# Patient Record
Sex: Female | Born: 1945
Health system: Southern US, Community
[De-identification: ages and names within clinical notes are randomized; demographics above are authoritative.]

## PROBLEM LIST (undated history)

## (undated) DIAGNOSIS — K219 Gastro-esophageal reflux disease without esophagitis: Secondary | ICD-10-CM

## (undated) DIAGNOSIS — K449 Diaphragmatic hernia without obstruction or gangrene: Secondary | ICD-10-CM

## (undated) DIAGNOSIS — K222 Esophageal obstruction: Secondary | ICD-10-CM

## (undated) DIAGNOSIS — J45909 Unspecified asthma, uncomplicated: Secondary | ICD-10-CM

## (undated) DIAGNOSIS — T7840XA Allergy, unspecified, initial encounter: Secondary | ICD-10-CM

## (undated) DIAGNOSIS — E785 Hyperlipidemia, unspecified: Secondary | ICD-10-CM

## (undated) DIAGNOSIS — G8929 Other chronic pain: Secondary | ICD-10-CM

## (undated) DIAGNOSIS — R1011 Right upper quadrant pain: Secondary | ICD-10-CM

## (undated) DIAGNOSIS — H269 Unspecified cataract: Secondary | ICD-10-CM

## (undated) DIAGNOSIS — J309 Allergic rhinitis, unspecified: Secondary | ICD-10-CM

## (undated) DIAGNOSIS — I10 Essential (primary) hypertension: Secondary | ICD-10-CM

## (undated) HISTORY — DX: Other chronic pain: G89.29

## (undated) HISTORY — DX: Allergic rhinitis, unspecified: J30.9

## (undated) HISTORY — DX: Esophageal obstruction: K22.2

## (undated) HISTORY — DX: Essential (primary) hypertension: I10

## (undated) HISTORY — DX: Diaphragmatic hernia without obstruction or gangrene: K44.9

## (undated) HISTORY — DX: Allergy, unspecified, initial encounter: T78.40XA

## (undated) HISTORY — DX: Unspecified cataract: H26.9

## (undated) HISTORY — PX: COLONOSCOPY: SHX174

## (undated) HISTORY — DX: Gastro-esophageal reflux disease without esophagitis: K21.9

## (undated) HISTORY — DX: Hyperlipidemia, unspecified: E78.5

## (undated) HISTORY — DX: Right upper quadrant pain: R10.11

## (undated) HISTORY — DX: Unspecified asthma, uncomplicated: J45.909

---

## 2008-07-24 LAB — HM COLONOSCOPY: HM Colonoscopy: NORMAL

## 2009-03-24 ENCOUNTER — Encounter: Admission: RE | Admit: 2009-03-24 | Discharge: 2009-03-24 | Payer: Self-pay | Admitting: Family Medicine

## 2011-06-24 ENCOUNTER — Encounter: Payer: Self-pay | Admitting: Family Medicine

## 2011-06-27 ENCOUNTER — Ambulatory Visit (INDEPENDENT_AMBULATORY_CARE_PROVIDER_SITE_OTHER): Payer: MEDICARE | Admitting: Family Medicine

## 2011-06-27 ENCOUNTER — Encounter: Payer: Self-pay | Admitting: Family Medicine

## 2011-06-27 DIAGNOSIS — J683 Other acute and subacute respiratory conditions due to chemicals, gases, fumes and vapors: Secondary | ICD-10-CM

## 2011-06-27 DIAGNOSIS — I10 Essential (primary) hypertension: Secondary | ICD-10-CM | POA: Insufficient documentation

## 2011-06-27 DIAGNOSIS — Z0000 Encounter for general adult medical examination without abnormal findings: Secondary | ICD-10-CM

## 2011-06-27 DIAGNOSIS — H6120 Impacted cerumen, unspecified ear: Secondary | ICD-10-CM

## 2011-06-27 DIAGNOSIS — J309 Allergic rhinitis, unspecified: Secondary | ICD-10-CM

## 2011-06-27 DIAGNOSIS — K219 Gastro-esophageal reflux disease without esophagitis: Secondary | ICD-10-CM | POA: Insufficient documentation

## 2011-06-27 DIAGNOSIS — E785 Hyperlipidemia, unspecified: Secondary | ICD-10-CM

## 2011-06-27 DIAGNOSIS — M810 Age-related osteoporosis without current pathological fracture: Secondary | ICD-10-CM | POA: Insufficient documentation

## 2011-06-27 LAB — LIPID PANEL
Cholesterol: 191 mg/dL (ref 0–200)
HDL: 56 mg/dL
LDL Cholesterol: 98 mg/dL (ref 0–99)
Total CHOL/HDL Ratio: 3.4 ratio
Triglycerides: 186 mg/dL — ABNORMAL HIGH
VLDL: 37 mg/dL (ref 0–40)

## 2011-06-27 LAB — COMPREHENSIVE METABOLIC PANEL
Albumin: 4.3 g/dL (ref 3.5–5.2)
CO2: 24 mEq/L (ref 19–32)
Glucose, Bld: 88 mg/dL (ref 70–99)
Potassium: 4.2 mEq/L (ref 3.5–5.3)
Sodium: 141 mEq/L (ref 135–145)
Total Protein: 7.8 g/dL (ref 6.0–8.3)

## 2011-06-27 MED ORDER — PRAVASTATIN SODIUM 40 MG PO TABS: 40.0000 mg | ORAL_TABLET | Freq: Every day | ORAL | Status: DC

## 2011-06-27 MED ORDER — ALBUTEROL SULFATE HFA 108 (90 BASE) MCG/ACT IN AERS: 2.0000 | INHALATION_SPRAY | Freq: Four times a day (QID) | RESPIRATORY_TRACT | Status: DC | PRN

## 2011-06-27 MED ORDER — SUCRALFATE 1 G PO TABS: 1.0000 g | ORAL_TABLET | Freq: Two times a day (BID) | ORAL | Status: DC

## 2011-06-27 MED ORDER — FAMOTIDINE 20 MG PO TABS: 20.0000 mg | ORAL_TABLET | Freq: Two times a day (BID) | ORAL | Status: DC

## 2011-06-27 MED ORDER — LISINOPRIL 5 MG PO TABS: 5.0000 mg | ORAL_TABLET | Freq: Every day | ORAL | Status: DC

## 2011-06-27 NOTE — Progress Notes (Signed)
Subjective:    Patient ID: Rhonda Burns, female    DOB: [birth year], 66 y.o.   MRN: [id number]  HPI  Patient presents for welcome to medicare CPE.  1) Allergies- waking up with dry mouth  2) Ceruminosis- requests [place] to check ears and irrigate if necessary. Reduced hearing  3) Gerd- increase symptoms recently; now with nocturnal cough  4) History of osteoporosis- was on Fosamax in the past; due for repeat DEXA.  5) H. Zoster- 10/12  SH/ Spends several months at a time in [place], [place] caring for grandchildren  Review of Systems  See scanned paperwork    Objective:   Physical Exam  Constitutional: She appears well-developed and well-nourished.  HENT:  Head: Normocephalic and atraumatic.  Ears:  Nose: Mucosal edema and rhinorrhea present.  Mouth/Throat: Oropharynx is clear and moist.  Eyes: Conjunctivae and EOM are normal. Pupils are equal, round, and reactive to light.  Neck: Neck supple. No thyromegaly present.  Cardiovascular: Normal rate, regular rhythm, normal heart sounds and intact distal pulses.   Pulmonary/Chest: Effort normal and breath sounds normal.  Abdominal: Soft. Bowel sounds are normal. There is no tenderness.  Musculoskeletal: Normal range of motion.  Lymphadenopathy:    She has no cervical adenopathy.  Neurological: She is alert.  Skin: Skin is warm.  Psychiatric: She has a normal mood and affect.     Results for orders placed in visit on 06/27/11  COMPREHENSIVE METABOLIC PANEL      Component Value Range   Sodium 141  135 - 145 (mEq/L)   Potassium 4.2  3.5 - 5.3 (mEq/L)   Chloride 104  96 - 112 (mEq/L)   CO2 24  19 - 32 (mEq/L)   Glucose, Bld 88  70 - 99 (mg/dL)   BUN 13  6 - 23 (mg/dL)   Creat 4.09  8.11 - 9.14 (mg/dL)   Total Bilirubin 0.7  0.3 - 1.2 (mg/dL)   Alkaline Phosphatase 77  39 - 117 (U/L)   AST 29  0 - 37 (U/L)   ALT 26  0 - 35 (U/L)   Total Protein 7.8  6.0 - 8.3 (g/dL)   Albumin 4.3  3.5 - 5.2 (g/dL)   Calcium 78.2  8.4 -  10.5 (mg/dL)  LIPID PANEL      Component Value Range   Cholesterol 191  0 - 200 (mg/dL)   Triglycerides 956 (*) <150 (mg/dL)   HDL 56  >21 (mg/dL)   Total CHOL/HDL Ratio 3.4     VLDL 37  0 - 40 (mg/dL)   LDL Cholesterol 98  0 - 99 (mg/dL)  TSH      Component Value Range   TSH 1.845  0.350 - 4.500 (uIU/mL)  VITAMIN D 1,25 DIHYDROXY      Component Value Range   Vitamin D 1, 25 (OH) Total       Vitamin D3 1, 25 (OH)       Vitamin D2 1, 25 (OH)          Assessment & Plan:   1. Routine general medical examination at a health care facility    2. Hypertension  Comprehensive metabolic panel, Lipid panel, TSH, lisinopril (PRINIVIL,ZESTRIL) 5 MG tablet  3. Hyperlipidemia  pravastatin (PRAVACHOL) 40 MG tablet  4. Osteoporosis  Vitamin D 1,25 dihydroxy, DG Bone Density  5. Allergic rhinitis, cause unspecified    6. GERD (gastroesophageal reflux disease)  sucralfate (CARAFATE) 1 G tablet, famotidine (PEPCID) 20 MG tablet  7. Cerumen impaction  Ear wax removal,  8. Reactive airways dysfunction syndrome  albuterol (VENTOLIN HFA) 108 (90 BASE) MCG/ACT inhaler   Follow up is in 6 months; sooner if concerns arise.

## 2011-06-28 NOTE — Progress Notes (Signed)
Quick Note:  Please call patient with normal labs to date. Vitamin D still pending.Thanks! KR  ______

## 2011-06-29 LAB — VITAMIN D 1,25 DIHYDROXY
Vitamin D2 1, 25 (OH)2: <8 pg/mL
Vitamin D3 1, 25 (OH)2: 69 pg/mL

## 2011-07-06 ENCOUNTER — Ambulatory Visit
Admission: RE | Admit: 2011-07-06 | Discharge: 2011-07-06 | Disposition: A | Discharge status: Home / Self Care | Payer: Medicare Other | Source: Ambulatory Visit | Attending: Family Medicine | Admitting: Family Medicine

## 2011-07-13 ENCOUNTER — Encounter: Payer: Self-pay | Admitting: Radiology

## 2011-07-28 ENCOUNTER — Telehealth: Payer: Self-pay | Admitting: Family Medicine

## 2011-07-28 NOTE — Telephone Encounter (Signed)
Please contact patient and let her know her DEXA is normal. Follow up DEXA is in 5 years

## 2011-07-29 NOTE — Telephone Encounter (Signed)
Spoke with patient and told her dexa scan was normal and will do repeat in 5 years,

## 2012-03-29 ENCOUNTER — Encounter: Payer: Self-pay | Admitting: Family Medicine

## 2012-03-29 ENCOUNTER — Ambulatory Visit (INDEPENDENT_AMBULATORY_CARE_PROVIDER_SITE_OTHER): Payer: MEDICARE | Admitting: Family Medicine

## 2012-03-29 VITALS — BP 120/62 | HR 64 | Temp 99.2°F | Resp 16 | Ht 60.5 in | Wt 137.2 lb

## 2012-03-29 DIAGNOSIS — L723 Sebaceous cyst: Secondary | ICD-10-CM

## 2012-03-29 DIAGNOSIS — I10 Essential (primary) hypertension: Secondary | ICD-10-CM

## 2012-03-29 DIAGNOSIS — M79609 Pain in unspecified limb: Secondary | ICD-10-CM

## 2012-03-29 DIAGNOSIS — M79669 Pain in unspecified lower leg: Secondary | ICD-10-CM

## 2012-03-29 MED ORDER — SULFAMETHOXAZOLE-TMP DS 800-160 MG PO TABS: 1.0000 | ORAL_TABLET | Freq: Two times a day (BID) | ORAL | Status: DC

## 2012-03-29 NOTE — Progress Notes (Signed)
S:  Pt is here w/ "nagging pain" in right leg x 3 years. Onset 3 years.Has tried Aleve 1 tab hs some nights as needed -"when pain becomes unbearable". No history of trauma Smoker until 36 years ago. Pt denies coldness or numbness, no muscle cramps. Hx of Vit D def; takes Vit D3. Walks her dog 2 miles every day. Pt stays active every day , rain or shine.  Pt also has recurrent "boils" all over; today she has one in left armpit along with a "lump". It is not painful. The boil has had some d/c which smells. She has had this problem on and off for years. Surgery has been performed on armpit lesions. She has not been febrile, had chills, n/v, HA or rash.  ROS: As per HPI.  O:  Filed Vitals:   03/29/12 1130  BP: 120/62  Pulse: 64  Temp: 99.2 F (37.3 C)  Resp: 16   GEN: In NAD; WN/WD. HENT: [place]/AT; EOMI w/ clear conj and scl/ EACs/TMs normal. Oroph benign. NECK: Supple w/o LAN ot TMG. COR: RRR. Distal pulses normal (2+/=). LUNGS: Normal resp rate and effort. SKIN: Left axilla- scarring with 1 cm round minimally tender cystic lesion; no d/c. 2.5-3 cm soft mass lateral and superior to area of scarring.           Right axilla- similar appearance but absent acute lesion; soft tissue mass also present.           Back- cystic lesions w/ black plugs; nontender and no active d/c. MS: MAEs; no c/c/e. Right shin/calf area- no redness or swelling or tenderness.  NEURO: A&O x 3; CNs intact. Nonfocal. Gait is normal.   A/P: 1. Infected sebaceous cyst of skin   RX: SMZ-TMP D.S. 1 tab bid  #60 w/ 1 RF.  2. Lower leg pain   Conservative treatment recommended. If no improvement, RTC for xray. (?DEXA)  3. HTN, goal below 130/80   Stable; continue current medication.

## 2012-03-29 NOTE — Patient Instructions (Addendum)
Epidermal Cyst An epidermal cyst is usually a small, painless lump under the skin. Cysts often occur on the face, neck, stomach, chest, or genitals. The cyst may be filled with a bad smelling paste. Do not pop your cyst. Popping the cyst can cause pain and puffiness (swelling). HOME CARE   Only take medicines as told by your doctor.  Take your medicine (antibiotics) as told. Finish it even if you start to feel better. GET HELP RIGHT AWAY IF:  Your cyst is tender, red, or puffy.  You are not getting better, or you are getting worse.  You have any questions or concerns. MAKE SURE YOU:  Understand these instructions.  Will watch your condition.  Will get help right away if you are not doing well or get worse. Document Released: 05/19/2004 Document Revised: 10/11/2011 Document Reviewed: 10/18/2010 [facility] Patient Information 2013 [place], [place].   You can use Arnica Gel topically on painful areas of the body; rub in  Small amount 2-3 times a day. Also, you can take Tumeric 1/4-1/2 teaspoon mixed with foods or take 1/2 teaspoon of mustard daily for joint pains.

## 2012-05-31 ENCOUNTER — Other Ambulatory Visit: Payer: Self-pay | Admitting: *Deleted

## 2012-05-31 DIAGNOSIS — I10 Essential (primary) hypertension: Secondary | ICD-10-CM

## 2012-05-31 MED ORDER — LISINOPRIL 5 MG PO TABS: 5.0000 mg | ORAL_TABLET | Freq: Every day | ORAL | Status: DC

## 2012-06-14 ENCOUNTER — Telehealth: Payer: Self-pay

## 2012-06-14 DIAGNOSIS — I10 Essential (primary) hypertension: Secondary | ICD-10-CM

## 2012-06-14 MED ORDER — LISINOPRIL 5 MG PO TABS: 5.0000 mg | ORAL_TABLET | Freq: Every day | ORAL | Status: DC

## 2012-06-14 NOTE — Telephone Encounter (Signed)
Patient would like [place] to call her regarding her prescriptions please call her at [phone]

## 2012-06-14 NOTE — Telephone Encounter (Signed)
Spoke to patient she is in [place] until March 8th., she is running low on her Lisinopril and needed 30 day Rx sent in to pharmacy in [place], this is done for her. Amy

## 2012-06-14 NOTE — Telephone Encounter (Signed)
Called pt left message for her to call me back

## 2012-07-05 ENCOUNTER — Ambulatory Visit: Payer: MEDICARE

## 2012-07-05 ENCOUNTER — Ambulatory Visit (INDEPENDENT_AMBULATORY_CARE_PROVIDER_SITE_OTHER): Payer: MEDICARE | Admitting: Family Medicine

## 2012-07-05 ENCOUNTER — Encounter: Payer: Self-pay | Admitting: Family Medicine

## 2012-07-05 VITALS — BP 134/73 | HR 78 | Temp 97.3°F | Resp 16 | Ht 61.0 in | Wt 133.0 lb

## 2012-07-05 DIAGNOSIS — I10 Essential (primary) hypertension: Secondary | ICD-10-CM

## 2012-07-05 DIAGNOSIS — M25569 Pain in unspecified knee: Secondary | ICD-10-CM

## 2012-07-05 DIAGNOSIS — M25561 Pain in right knee: Secondary | ICD-10-CM

## 2012-07-05 DIAGNOSIS — E785 Hyperlipidemia, unspecified: Secondary | ICD-10-CM

## 2012-07-05 LAB — COMPREHENSIVE METABOLIC PANEL
ALT: 17 U/L (ref 0–35)
AST: 22 U/L (ref 0–37)
Albumin: 4.2 g/dL (ref 3.5–5.2)
Calcium: 9.4 mg/dL (ref 8.4–10.5)
Chloride: 109 mEq/L (ref 96–112)
Creat: 0.77 mg/dL (ref 0.50–1.10)
Potassium: 3.6 mEq/L (ref 3.5–5.3)

## 2012-07-05 LAB — CBC WITH DIFFERENTIAL/PLATELET
Basophils Relative: 0 % (ref 0–1)
Eosinophils Absolute: 0.2 10*3/uL (ref 0.0–0.7)
Hemoglobin: 12 g/dL (ref 12.0–15.0)
MCH: 29.1 pg (ref 26.0–34.0)
MCHC: 33.9 g/dL (ref 30.0–36.0)
Monocytes Relative: 8 % (ref 3–12)
Neutrophils Relative %: 52 % (ref 43–77)

## 2012-07-05 MED ORDER — PRAVASTATIN SODIUM 40 MG PO TABS: 40.0000 mg | ORAL_TABLET | Freq: Every day | ORAL | Status: DC

## 2012-07-05 MED ORDER — LISINOPRIL 5 MG PO TABS: 5.0000 mg | ORAL_TABLET | Freq: Every day | ORAL | Status: DC

## 2012-07-05 NOTE — Progress Notes (Signed)
S:  This 67 y.o. AA female is here for HTN follow-up; BP is controlled w/o adverse medication effects. She denies fatigue, abnormal weight change, CP or tightness, palpitations, edema, SOB or cough, HA, numbness, weakness or syncope.   Pt continues to have lateral right lower leg pain, responsive to Aleve and other nonpharmacologic measures. She reports that she used to run daily > 15 years ago; she wore good running shoes and denies any hx of injury while running.  Lower leg pain is associated w/ knee pain w/o redness, swelling, give-way or significant weakness. She is concerned about bone cancer because her 25 y.o. sister just died secondary to breast cancer that metastasized to bone.  ROS: As per HPI; negative for fever/ chills, diaphoresis, fatigue, abnormal weight loss, long bone ache or pain, rashes or abnormal bruising or enlarged lymph nodes.   O:  Filed Vitals:   07/05/12 1351  BP: 134/73  Pulse: 78  Temp: 97.3 F (36.3 C)  Resp: 16   GEN: In nAD; WN,WD. HENT: [place]/AT; EOMI w/ clear conj/ sclerae. Oroph clear and moist. COR: RRR. No edema. LUNGS: Normal resp rate and effort. MS: R lower leg- (lateral aspect anterior to calf muscle) nontender firm immobile 3-4 cm nodule. Knee joint is normal w/o effusion, warmth or redness.        All other major joints and muscle groups are normal. SKIN: W&D; no rashes or erythema. NEURO: A&O x 3; CNs intact. Motor/ sensory function is normal. Gait is normal.   [facility] reading (PRIMARY) by  Dr. Audria Nine: R Lower leg (L lower done for comparison)-  Tibia and fibula appear normal; R lateral aspect of calf shows muscle scar or soft-tissue swelling.   A/P:  Pain in joint, lower leg, right - Plan: DG Tibia/Fibula Right, Sedimentation Rate, CBC with Differential Get OTC topical analgesic and use as directed.  Hypertension - controlled.   Plan: lisinopril (PRINIVIL,ZESTRIL) 5 MG tablet, Comprehensive metabolic panel, CBC with  Differential  Hyperlipidemia - Plan: pravastatin (PRAVACHOL) 40 MG tablet

## 2012-07-05 NOTE — Patient Instructions (Signed)
Muscle Tear A muscle tear is usually caused by over-exertion or stretching. The muscle often takes a while to heal. Muscle tears require 3 to 4 weeks to heal completely. A history of the injury and a physical exam may be performed. Sometimes, the injury is identified with radiographs and an MRI. Treatment for muscle injuries includes:  Resting and protecting the affected area until pain with motion is gone.  Putting ice on the injured area.  Put ice in a bag.  Place a towel between your skin and the bag.  Leave the ice on for 15 to 20 minutes, 3 to 4 times a day.  After two days, you can use heat to relieve spasms.  Using compression wraps to help control swelling and limit movement.  Raising (elevate) the injured area above the level of the heart (if possible) for the first 1 to 2 days after the injury.  Medicine may be prescribed to reduce pain and inflammation. Avoid strenuous activities that bring on muscle pain. Exercises to strengthen and stretch the injured muscle can help heal the muscle and prevent repeated injury. After the pain and swelling are gone, you may begin gradual strengthening exercises. Begin range-of-motion exercises and gentle stretching after 3 to 4 days of rest.  SEEK MEDICAL CARE IF:  Your injured muscle is not improving after 1 week of treatment. Document Released: 05/19/2004 Document Revised: 07/04/2011 Document Reviewed: 10/24/2008 [facility] Patient Information 2013 [place], [place].    You can continue to take Aleve 1 tablet daily with food for pain relief; the "horse linament" that you are going to try is a good idea. Continue to wear good footwear to help reduce strain on your lower legs.

## 2012-07-06 NOTE — Progress Notes (Signed)
Quick Note:  Please notify pt that results are normal.   Provide pt with copy of labs. ______ 

## 2012-07-07 ENCOUNTER — Encounter: Payer: Self-pay | Admitting: *Deleted

## 2012-07-19 ENCOUNTER — Encounter: Payer: Self-pay | Admitting: Family Medicine

## 2012-08-23 ENCOUNTER — Encounter: Payer: MEDICARE | Admitting: Family Medicine

## 2012-09-27 ENCOUNTER — Ambulatory Visit (INDEPENDENT_AMBULATORY_CARE_PROVIDER_SITE_OTHER): Payer: MEDICARE | Admitting: Family Medicine

## 2012-09-27 ENCOUNTER — Encounter: Payer: Self-pay | Admitting: Family Medicine

## 2012-09-27 VITALS — BP 130/66 | HR 73 | Temp 98.7°F | Resp 16 | Ht 60.5 in | Wt 136.6 lb

## 2012-09-27 DIAGNOSIS — Z139 Encounter for screening, unspecified: Secondary | ICD-10-CM

## 2012-09-27 DIAGNOSIS — Z1159 Encounter for screening for other viral diseases: Secondary | ICD-10-CM

## 2012-09-27 DIAGNOSIS — E785 Hyperlipidemia, unspecified: Secondary | ICD-10-CM

## 2012-09-27 DIAGNOSIS — Z0000 Encounter for general adult medical examination without abnormal findings: Secondary | ICD-10-CM

## 2012-09-27 DIAGNOSIS — J45909 Unspecified asthma, uncomplicated: Secondary | ICD-10-CM

## 2012-09-27 DIAGNOSIS — J683 Other acute and subacute respiratory conditions due to chemicals, gases, fumes and vapors: Secondary | ICD-10-CM | POA: Insufficient documentation

## 2012-09-27 LAB — POCT URINALYSIS DIPSTICK
Blood, UA: NEGATIVE
Ketones, UA: NEGATIVE
Leukocytes, UA: NEGATIVE
Protein, UA: NEGATIVE
Spec Grav, UA: >=1.03
pH, UA: 5.5

## 2012-09-27 LAB — ALT: ALT: 20 U/L (ref 0–35)

## 2012-09-27 LAB — HEPATITIS C ANTIBODY: HCV Ab: NEGATIVE

## 2012-09-27 LAB — LIPID PANEL
LDL Cholesterol: 34 mg/dL (ref 0–99)
Total CHOL/HDL Ratio: 2.2 Ratio
VLDL: 24 mg/dL (ref 0–40)

## 2012-09-27 LAB — IFOBT (OCCULT BLOOD): IFOBT: NEGATIVE

## 2012-09-27 MED ORDER — SUCRALFATE 1 G PO TABS: 1.0000 g | ORAL_TABLET | Freq: Two times a day (BID) | ORAL | Status: DC

## 2012-09-27 MED ORDER — ALBUTEROL SULFATE HFA 108 (90 BASE) MCG/ACT IN AERS: 2.0000 | INHALATION_SPRAY | Freq: Four times a day (QID) | RESPIRATORY_TRACT | Status: DC | PRN

## 2012-09-27 MED ORDER — PRAVASTATIN SODIUM 40 MG PO TABS: 40.0000 mg | ORAL_TABLET | Freq: Every day | ORAL | Status: DC

## 2012-09-27 MED ORDER — FLUTICASONE PROPIONATE 50 MCG/ACT NA SUSP: 2.0000 | Freq: Every day | NASAL | Status: DC

## 2012-09-27 MED ORDER — LISINOPRIL 5 MG PO TABS: 5.0000 mg | ORAL_TABLET | Freq: Every day | ORAL | Status: DC

## 2012-09-27 NOTE — Patient Instructions (Signed)

## 2012-09-27 NOTE — Progress Notes (Signed)
Subjective:    Patient ID: Rhonda Burns, female    DOB: [birth year], 67 y.o.   MRN: [id number]  HPI  This 67 y.o. AA female is here for [facility] annual wellness exam. She has HTN and lipid disorder  treated with chronic medications w/o adverse effects.    HCM: PAP- March 2012 (normal)            MMG- pt has appt. 10/04/12.            ECG- 06/12/2012 (normal).            DEXA- 07/05/2012 (normal).            CRS- 07/31/2008 (normal).            IMM- current; needs Zostavax.  Patient Active Problem List   Diagnosis Date Noted  . Hypertension   . Hyperlipidemia   . Osteoporosis   . Allergic rhinitis, cause unspecified   . GERD (gastroesophageal reflux disease)     PMHx, Soc Hx and Fam Hx reviewed.    Review of Systems  Constitutional: Negative.   HENT: Positive for sneezing.   Eyes: Positive for itching.       Associated w/ A.R.  Respiratory: Positive for cough.        Associated w/ A.R.  Cardiovascular: Negative.   Gastrointestinal: Positive for abdominal pain and blood in stool.       Pt has hemorrhoids w/ intermittent abd pain; reports a lot of gas, sometimes associated w/ certain foods.  Endocrine:       Change in hair  Genitourinary: Positive for urgency and flank pain.       This is chronic and evaluated w/ imaging but no clear etiology found; pt has problems w/ constipation.  Musculoskeletal: Positive for arthralgias.  Skin: Negative.        Chronic recurrent cystic lesions; removed in past but always recur.  Allergic/Immunologic: Negative.   Neurological: Positive for light-headedness.       Sometimes postural.  Hematological: Negative.   Psychiatric/Behavioral: Positive for decreased concentration. The patient is nervous/anxious.        This is chronic and pt not interested in medication.       Objective:   Physical Exam  Nursing note and vitals reviewed. Constitutional: She is oriented to person, place, and time. Vital signs are normal. She appears well-developed and  well-nourished. No distress.  HENT:  Head: Normocephalic and atraumatic.  Right Ear: Hearing, external ear and ear canal normal. Tympanic membrane is scarred. Tympanic membrane is not injected and not retracted. No middle ear effusion. No decreased hearing is noted.  Left Ear: Hearing, tympanic membrane and external ear normal. Tympanic membrane is not injected and not retracted.  No middle ear effusion. No decreased hearing is noted.  Nose: Nose normal. No mucosal edema, rhinorrhea, nasal deformity or septal deviation.  Mouth/Throat: Uvula is midline, oropharynx is clear and moist and mucous membranes are normal. She has dentures. No oral lesions.  Dentures- upper plate only.  Eyes: Conjunctivae, EOM and lids are normal. Pupils are equal, round, and reactive to light. No scleral icterus.  Fundoscopic exam:      The right eye shows no arteriolar narrowing, no AV nicking and no papilledema. The right eye shows red reflex.       The left eye shows no arteriolar narrowing, no AV nicking and no papilledema. The left eye shows red reflex.  Neck: Normal range of motion. Neck supple. No JVD present. No thyromegaly present.  Cardiovascular: Normal rate, regular rhythm, normal heart sounds and intact distal pulses.  Exam reveals no gallop and no friction rub.   No murmur heard. Pulmonary/Chest: Effort normal and breath sounds normal. No respiratory distress. She has no wheezes. She has no rales. Right breast exhibits no inverted nipple, no mass, no nipple discharge, no skin change and no tenderness. Left breast exhibits no inverted nipple, no mass, no nipple discharge, no skin change and no tenderness. Breasts are symmetrical.  Abdominal: Soft. Normal appearance, normal aorta and bowel sounds are normal. She exhibits no distension, no pulsatile midline mass and no mass. There is no hepatosplenomegaly. There is no tenderness. There is no guarding and no CVA tenderness. No hernia.  Genitourinary: Rectal exam  shows external hemorrhoid. Rectal exam shows no fissure, no mass, no tenderness and anal tone normal. Guaiac negative stool.  NEFG; no PAP or pelvic exam performed.  Musculoskeletal: Normal range of motion. She exhibits no edema and no tenderness.  Lymphadenopathy:       Head (right side): No submental, no submandibular, no tonsillar, no posterior auricular and no occipital adenopathy present.       Head (left side): No submental, no submandibular, no tonsillar, no posterior auricular and no occipital adenopathy present.    She has no cervical adenopathy.       Right cervical: No superficial cervical and no posterior cervical adenopathy present.      Left cervical: No superficial cervical and no posterior cervical adenopathy present.    She has no axillary adenopathy.       Right: No inguinal and no supraclavicular adenopathy present.       Left: No inguinal and no supraclavicular adenopathy present.  Neurological: She is alert and oriented to person, place, and time. She has normal reflexes. No cranial nerve deficit. She exhibits normal muscle tone. Coordination normal.  Skin: Skin is warm and dry.  Back (mid upper)- 1.5 cm firm nodule with blackhead; nontender and no active drainage.   Psychiatric: She has a normal mood and affect. Her behavior is normal. Judgment and thought content normal.     Results for orders placed in visit on 09/27/12  POCT URINALYSIS DIPSTICK      Result Value Range   Color, UA yellow     Clarity, UA clear     Glucose, UA neg     Bilirubin, UA neg     Ketones, UA neg     Spec Grav, UA >=1.030     Blood, UA neg     pH, UA 5.5     Protein, UA neg     Urobilinogen, UA 0.2     Nitrite, UA neg     Leukocytes, UA Negative    IFOBT (OCCULT BLOOD)      Result Value Range   IFOBT Negative          Assessment & Plan:  Routine general medical examination at a health care facility - Plan: POCT urinalysis dipstick, IFOBT POC (occult bld, rslt in  office)  Other and unspecified hyperlipidemia - encouraged healthy nutrition (Mediterranean Diet) Plan: Lipid panel, ALT  Reactive airways dysfunction syndrome, unspecified asthma severity, uncomplicated - Plan: albuterol (VENTOLIN HFA) 108 (90 BASE) MCG/ACT inhaler  Need for hepatitis C screening test - Plan: Hepatitis C antibody  Continue current medications: Lisinopril 5 mg 1 tab daily, Pravastatin 40 mg 1 tab at bedtime, Sucralfate 1 tab bid prn.

## 2012-09-28 ENCOUNTER — Telehealth: Payer: Self-pay | Admitting: Family Medicine

## 2012-09-28 NOTE — Progress Notes (Signed)
Quick Note:  Please notify pt that results are normal.   Provide pt with copy of labs. ______ 

## 2012-09-28 NOTE — Telephone Encounter (Signed)
[facility] Carafate, albuterol, flonase, lisinopril, pravastatin.

## 2012-09-29 ENCOUNTER — Encounter: Payer: Self-pay | Admitting: *Deleted

## 2012-10-18 ENCOUNTER — Encounter: Payer: Self-pay | Admitting: Family Medicine

## 2012-11-28 ENCOUNTER — Other Ambulatory Visit: Payer: Self-pay

## 2012-11-29 ENCOUNTER — Ambulatory Visit: Payer: MEDICARE | Admitting: Family Medicine

## 2013-02-28 ENCOUNTER — Other Ambulatory Visit: Payer: Self-pay

## 2013-03-19 ENCOUNTER — Encounter (HOSPITAL_COMMUNITY): Payer: Self-pay | Admitting: Emergency Medicine

## 2013-03-19 ENCOUNTER — Emergency Department (HOSPITAL_COMMUNITY)
Admission: EM | Admit: 2013-03-19 | Discharge: 2013-03-20 | Disposition: A | Discharge status: Home / Self Care | Payer: Medicare Other | Attending: Emergency Medicine | Admitting: Emergency Medicine

## 2013-03-19 DIAGNOSIS — Z8739 Personal history of other diseases of the musculoskeletal system and connective tissue: Secondary | ICD-10-CM | POA: Insufficient documentation

## 2013-03-19 DIAGNOSIS — T7840XA Allergy, unspecified, initial encounter: Secondary | ICD-10-CM

## 2013-03-19 DIAGNOSIS — L272 Dermatitis due to ingested food: Secondary | ICD-10-CM | POA: Insufficient documentation

## 2013-03-19 DIAGNOSIS — Z87891 Personal history of nicotine dependence: Secondary | ICD-10-CM | POA: Insufficient documentation

## 2013-03-19 DIAGNOSIS — I10 Essential (primary) hypertension: Secondary | ICD-10-CM | POA: Insufficient documentation

## 2013-03-19 DIAGNOSIS — J45909 Unspecified asthma, uncomplicated: Secondary | ICD-10-CM | POA: Insufficient documentation

## 2013-03-19 DIAGNOSIS — IMO0002 Reserved for concepts with insufficient information to code with codable children: Secondary | ICD-10-CM | POA: Insufficient documentation

## 2013-03-19 DIAGNOSIS — Z7982 Long term (current) use of aspirin: Secondary | ICD-10-CM | POA: Insufficient documentation

## 2013-03-19 DIAGNOSIS — E785 Hyperlipidemia, unspecified: Secondary | ICD-10-CM | POA: Insufficient documentation

## 2013-03-19 DIAGNOSIS — K219 Gastro-esophageal reflux disease without esophagitis: Secondary | ICD-10-CM | POA: Insufficient documentation

## 2013-03-19 DIAGNOSIS — Z79899 Other long term (current) drug therapy: Secondary | ICD-10-CM | POA: Insufficient documentation

## 2013-03-19 MED ORDER — FAMOTIDINE 20 MG PO TABS
20.0000 mg | ORAL_TABLET | Freq: Once | ORAL | Status: AC
  Administered 2013-03-19: 20 mg via ORAL
  Filled 2013-03-19: qty 1

## 2013-03-19 MED ORDER — PREDNISONE 20 MG PO TABS
60.0000 mg | ORAL_TABLET | Freq: Once | ORAL | Status: AC
  Administered 2013-03-19: 60 mg via ORAL
  Filled 2013-03-19: qty 3

## 2013-03-19 NOTE — ED Notes (Signed)
Pt states she was eating a piece of [place] chocolate cake and she believes that she had a reaction to the pecans in the cake. Pt states her eyes got swollen and she felt like she had a rash on her face. Pt has no visible hives or redness to face. Pt is not in respiratory distress. Pt took 2 benadryl prior to arrival ago.

## 2013-03-19 NOTE — ED Provider Notes (Signed)
CSN: [id number]     Arrival date & time 03/19/13  2227 History   First MD Initiated Contact with Patient 03/19/13 2343     Chief Complaint  Patient presents with  . Allergic Reaction   (Consider location/radiation/quality/duration/timing/severity/associated sxs/prior Treatment) Patient is a 67 y.o. female presenting with allergic reaction. The history is provided by the patient.  Allergic Reaction Presenting symptoms: swelling   Presenting symptoms: no difficulty swallowing   Severity:  Moderate Prior allergic episodes:  No prior episodes Context: food   Context comment:  Immediately after german chocolate cake Relieved by:  Antihistamines Worsened by:  Nothing tried Ineffective treatments:  None tried Symptoms resolved by evaluation  Past Medical History  Diagnosis Date  . Hypertension   . Hyperlipidemia   . Osteoporosis   . Allergy   . Osteoporosis   . Allergic rhinitis, cause unspecified   . GERD (gastroesophageal reflux disease)   . Asthma    Past Surgical History  Procedure Laterality Date  . Cesarean section     Family History  Problem Relation Age of Onset  . Alzheimer's disease Mother   . Asthma Sister    History  Substance Use Topics  . Smoking status: Former Games developer  . Smokeless tobacco: Not on file  . Alcohol Use: No   OB History   Grav Para Term Preterm Abortions TAB SAB Ect Mult Living                 Review of Systems  HENT: Positive for facial swelling. Negative for drooling and trouble swallowing.   All other systems reviewed and are negative.    Allergies  Review of patient's allergies indicates no known allergies.  Home Medications   Current Outpatient Rx  Name  Route  Sig  Dispense  Refill  . EXPIRED: albuterol (PROVENTIL HFA;VENTOLIN HFA) 108 (90 BASE) MCG/ACT inhaler   Inhalation   Inhale 2 puffs into the lungs every 6 (six) hours as needed for wheezing. NEED REFILLS         . albuterol (VENTOLIN HFA) 108 (90 BASE) MCG/ACT  inhaler   Inhalation   Inhale 2 puffs into the lungs every 6 (six) hours as needed for wheezing.   1 Inhaler   3   . aspirin 325 MG tablet   Oral   Take 325 mg by mouth daily.         . calcium citrate-vitamin D (CITRACAL+D) 315-200 MG-UNIT per tablet   Oral   Take 1 tablet by mouth 2 (two) times daily.         . calcium gluconate 500 MG tablet   Oral   Take 500 mg by mouth daily.         Marland Kitchen EXPIRED: famotidine (PEPCID) 20 MG tablet   Oral   Take 20 mg by mouth 2 (two) times daily. TAKE AS NEEDED         . fish oil-omega-3 fatty acids 1000 MG capsule   Oral   Take 2 g by mouth daily.         . fluticasone (FLONASE) 50 MCG/ACT nasal spray   Nasal   Place 2 sprays into the nose daily.   16 g   6   . lisinopril (PRINIVIL,ZESTRIL) 5 MG tablet   Oral   Take 1 tablet (5 mg total) by mouth daily.   90 tablet   3   . pravastatin (PRAVACHOL) 40 MG tablet   Oral   Take 1 tablet (40 mg  total) by mouth daily.   90 tablet   3   . sucralfate (CARAFATE) 1 G tablet   Oral   Take 1 tablet (1 g total) by mouth 2 (two) times daily.   180 tablet   1    BP 150/65  Pulse 121  Temp(Src) 97.7 F (36.5 C) (Oral)  Resp 18  Wt 140 lb 11.2 oz (63.821 kg)  SpO2 93% Physical Exam  Constitutional: She is oriented to person, place, and time. She appears well-developed and well-nourished. No distress.  HENT:  Head: Normocephalic and atraumatic.  Mouth/Throat: Oropharynx is clear and moist.  No swelling of the lips tongue or uvula  Eyes: Conjunctivae are normal. Pupils are equal, round, and reactive to light.  Neck: Normal range of motion. Neck supple.  Cardiovascular: Normal rate, regular rhythm and intact distal pulses.   Pulmonary/Chest: Effort normal and breath sounds normal. No stridor. No respiratory distress. She has no wheezes.  Abdominal: Soft. Bowel sounds are normal. There is no tenderness. There is no rebound and no guarding.  Musculoskeletal: Normal range of  motion.  Neurological: She is alert and oriented to person, place, and time.  Skin: Skin is warm and dry. No rash noted.  Psychiatric: She has a normal mood and affect.    ED Course  Procedures (including critical care time) Labs Review Labs Reviewed - No data to display Imaging Review No results found.  EKG Interpretation   None       MDM  No diagnosis found. Feeling well no more german chocolate cake or any of its components.  No nuts of any kind.  Inform your physician will dc with epi pen and steroids.  Patient takes pepcid.  Take benadryl every six hours    Mili Piltz K Tashara Suder-Rasch, MD 03/20/13 [phone]

## 2013-03-19 NOTE — ED Notes (Signed)
Pt. Reports facial swelling after eating nuts around 2130 with slight difficulty breathing. Pt. Took two benadryl and reports breathing and swelling are better. Airway intact, no difficulty breathing or SOB. Pt. Resting in bed comfortably.

## 2013-03-20 ENCOUNTER — Encounter (HOSPITAL_COMMUNITY): Payer: Self-pay | Admitting: Emergency Medicine

## 2013-03-20 ENCOUNTER — Encounter: Payer: Self-pay | Admitting: Family Medicine

## 2013-03-20 ENCOUNTER — Ambulatory Visit (INDEPENDENT_AMBULATORY_CARE_PROVIDER_SITE_OTHER): Payer: MEDICARE | Admitting: Family Medicine

## 2013-03-20 VITALS — BP 158/80 | HR 85 | Temp 98.4°F | Resp 16 | Ht 59.5 in | Wt 147.0 lb

## 2013-03-20 DIAGNOSIS — T7840XD Allergy, unspecified, subsequent encounter: Secondary | ICD-10-CM

## 2013-03-20 DIAGNOSIS — I10 Essential (primary) hypertension: Secondary | ICD-10-CM

## 2013-03-20 DIAGNOSIS — Z5189 Encounter for other specified aftercare: Secondary | ICD-10-CM

## 2013-03-20 MED ORDER — EPINEPHRINE 0.3 MG/0.3ML IJ SOAJ: 0.3000 mg | Freq: Once | INTRAMUSCULAR | Status: DC

## 2013-03-20 MED ORDER — SULFAMETHOXAZOLE-TRIMETHOPRIM 800-160 MG PO TABS: 1.0000 | ORAL_TABLET | Freq: Two times a day (BID) | ORAL | Status: DC

## 2013-03-20 MED ORDER — PREDNISONE 20 MG PO TABS: ORAL_TABLET | ORAL | Status: DC

## 2013-03-20 NOTE — Progress Notes (Signed)
S:  This 67 y.o. AA female is here for HTN follow-up. She is compliant w/ medications w/o adverse effects. Unfortunately, she had to seek care in [facility] ED last evening for allergic reaction to food item; pt ingested quite a bit of chocolate yesterday. No previous sensitivity or allergy known. Facial swelling occurred after eating a slice of [place] Choc cake; pt took Benadryl and was transported to ED by family members. She was treated and released w/ RXs for oral steroids and Epi-Pen. She is feeling better today but still has some swelling about the eyes and states she does not "feel quite right" in her head.  Patient Active Problem List   Diagnosis Date Noted  . Reactive airways dysfunction syndrome 09/27/2012  . Hypertension   . Hyperlipidemia   . Osteoporosis   . Allergic rhinitis, cause unspecified   . GERD (gastroesophageal reflux disease)    PMHx, Soc and Fam Hx reviewed.  Medications reconciled.  ROS: AS per HPI.  O: Filed Vitals:   03/20/13 1036  BP: 158/80  Pulse: 85  Temp: 98.4 F (36.9 C)  Resp: 16   GEN: In NAD; WN,WD. HENT: [place]/AT; EOMI w/ periorbital edema (mild). Otherwise unremarkable. COR: RRR. LUNGS: Normal resp rate and effort. SKIN: W&D; intact w/o erythema or rashes. NEURO: A&O x 3; CNs intact. Nonfocal.  A/P: Hypertension- Stable; no medication changes at this time.  Allergic reaction, subsequent encounter- Food avoidance as advised; take oral steroid as directed. Get Epi-Pen to have on hand if needed. Allergy referral if recurrence; otherwise, pt prefers to do watchful waiting.

## 2013-03-29 ENCOUNTER — Ambulatory Visit: Payer: MEDICARE | Admitting: Family Medicine

## 2013-07-05 ENCOUNTER — Encounter: Payer: Self-pay | Admitting: Family Medicine

## 2013-07-05 ENCOUNTER — Ambulatory Visit: Payer: Medicare Other

## 2013-07-05 ENCOUNTER — Ambulatory Visit (INDEPENDENT_AMBULATORY_CARE_PROVIDER_SITE_OTHER): Payer: MEDICARE | Admitting: Family Medicine

## 2013-07-05 VITALS — BP 131/88 | HR 79 | Temp 98.6°F | Resp 16 | Ht 60.5 in | Wt 137.0 lb

## 2013-07-05 DIAGNOSIS — J683 Other acute and subacute respiratory conditions due to chemicals, gases, fumes and vapors: Secondary | ICD-10-CM

## 2013-07-05 DIAGNOSIS — I499 Cardiac arrhythmia, unspecified: Secondary | ICD-10-CM

## 2013-07-05 DIAGNOSIS — R0989 Other specified symptoms and signs involving the circulatory and respiratory systems: Secondary | ICD-10-CM

## 2013-07-05 DIAGNOSIS — J45909 Unspecified asthma, uncomplicated: Secondary | ICD-10-CM

## 2013-07-05 DIAGNOSIS — R0609 Other forms of dyspnea: Secondary | ICD-10-CM

## 2013-07-05 DIAGNOSIS — I10 Essential (primary) hypertension: Secondary | ICD-10-CM

## 2013-07-05 DIAGNOSIS — R0600 Dyspnea, unspecified: Secondary | ICD-10-CM

## 2013-07-05 LAB — CBC WITH DIFFERENTIAL/PLATELET
BASOS PCT: 0 % (ref 0–1)
Basophils Absolute: 0 10*3/uL (ref 0.0–0.1)
EOS ABS: 0.3 10*3/uL (ref 0.0–0.7)
EOS PCT: 4 % (ref 0–5)
HCT: 36.7 % (ref 36.0–46.0)
Hemoglobin: 12.5 g/dL (ref 12.0–15.0)
LYMPHS ABS: 2.5 10*3/uL (ref 0.7–4.0)
Lymphocytes Relative: 35 % (ref 12–46)
MCH: 29.2 pg (ref 26.0–34.0)
MCHC: 34.1 g/dL (ref 30.0–36.0)
MCV: 85.7 fL (ref 78.0–100.0)
Monocytes Absolute: 0.4 10*3/uL (ref 0.1–1.0)
Monocytes Relative: 6 % (ref 3–12)
NEUTROS PCT: 55 % (ref 43–77)
Neutro Abs: 3.9 10*3/uL (ref 1.7–7.7)
PLATELETS: 302 10*3/uL (ref 150–400)
RBC: 4.28 MIL/uL (ref 3.87–5.11)
RDW: 13.3 % (ref 11.5–15.5)
WBC: 7.1 10*3/uL (ref 4.0–10.5)

## 2013-07-05 NOTE — Patient Instructions (Addendum)
EKG today shows a below normal heart rate. The chest xray looks normal.  We will do these lab tests today to see if there is a metabolic reason for the symptoms and slow heart rate. If not, I may recommend you see a heart specialist (Cardiologist). Your hear rate in 2012 was 62 on the EKG done at that time.  Bradycardia Bradycardia is a term for a heart rate (pulse) that, in adults, is slower than 60 beats per minute. A normal rate is 60 to 100 beats per minute. A heart rate below 60 beats per minute may be normal for some adults with healthy hearts. If the rate is too slow, the heart may have trouble pumping the volume of blood the body needs. If the heart rate gets too low, blood flow to the brain may be decreased and may make you feel lightheaded, dizzy, or faint. The heart has a natural pacemaker in the top of the heart called the SA node (sinoatrial or sinus node). This pacemaker sends out regular electrical signals to the muscle of the heart, telling the heart muscle when to beat (contract). The electrical signal travels from the upper parts of the heart (atria) through the AV node (atrioventricular node), to the lower chambers of the heart (ventricles). The ventricles squeeze, pumping the blood from your heart to your lungs and to the rest of your body. CAUSES   Problem with the heart's electrical system.  Problem with the heart's natural pacemaker.  Heart disease, damage, or infection.  Medications.  Problems with minerals and salts (electrolytes). SYMPTOMS   Fainting (syncope).  Fatigue and weakness.  Shortness of breath (dyspnea).  Chest pain (angina).  Drowsiness.  Confusion. DIAGNOSIS   An electrocardiogram (ECG) can help your caregiver determine the type of slow heart rate you have.  If the cause is not seen on an ECG, you may need to wear a heart monitor that records your heart rhythm for several hours or days.  Blood tests. TREATMENT   Electrolyte  supplements.  Medications.  Withholding medication which is causing a slow heart rate.  Pacemaker placement. SEEK IMMEDIATE MEDICAL CARE IF:   You feel lightheaded or faint.  You develop an irregular heart rate.  You feel chest pain or have trouble breathing. MAKE SURE YOU:   Understand these instructions.  Will watch your condition.  Will get help right away if you are not doing well or get worse. Document Released: 01/01/2002 Document Revised: 07/04/2011 Document Reviewed: 11/28/2007 [facility] Patient Information 2014 [place].

## 2013-07-05 NOTE — Progress Notes (Signed)
S:  This 68 y.o. AA female has well controlled HTN and RAD vs. Mild Allergic Asthma. She c/o irreg heart beat when lying down. She has been SOB when climbing stairs but denies nocturnal cough or wheezing. She has intermittent mild fatigue. Pt denies diaphoresis, CP or tightness, HA, dizziness, facial asymmetry, hot or cold temperature intolerance, numbness or weakness, tremor or syncope. No GI upset reported. No rashes or erythema or pallor.  Patient Active Problem List   Diagnosis Date Noted  . Reactive airways dysfunction syndrome 09/27/2012  . Hypertension   . Hyperlipidemia   . Osteoporosis   . Allergic rhinitis, cause unspecified   . GERD (gastroesophageal reflux disease)     Prior to Admission medications   Medication Sig Start Date End Date Taking? Authorizing Provider  albuterol (VENTOLIN HFA) 108 (90 BASE) MCG/ACT inhaler Inhale 2 puffs into the lungs every 6 (six) hours as needed for wheezing. 09/27/12 09/27/13 Yes Barton Fanny, MD  aspirin 325 MG tablet Take 325 mg by mouth daily.   Yes Historical Provider, MD  calcium gluconate 500 MG tablet Take 500 mg by mouth daily.   Yes Historical Provider, MD  fish oil-omega-3 fatty acids 1000 MG capsule Take 1 g by mouth daily.    Yes Historical Provider, MD  fluticasone (FLONASE) 50 MCG/ACT nasal spray Place 2 sprays into the nose daily as needed for allergies. 09/27/12  Yes Barton Fanny, MD  lisinopril (PRINIVIL,ZESTRIL) 5 MG tablet Take 1 tablet (5 mg total) by mouth daily. 09/27/12  Yes Barton Fanny, MD  pravastatin (PRAVACHOL) 40 MG tablet Take 1 tablet (40 mg total) by mouth daily. 09/27/12  Yes Barton Fanny, MD  sulfamethoxazole-trimethoprim (SMZ-TMP DS) 800-160 MG per tablet Take 1 tablet by mouth 2 (two) times daily. 03/20/13  Yes Barton Fanny, MD  EPINEPHrine (EPI-PEN) 0.3 mg/0.3 mL SOAJ injection Inject 0.3 mLs (0.3 mg total) into the muscle once. As need for allergic reaction causing throat swelling  03/20/13   April K Palumbo-Rasch, MD  meloxicam (MOBIC) 15 MG tablet Take 15 mg by mouth daily.    Historical Provider, MD   PMHx, Surg Hx, Soc and Fam Hx reviewed.  ROS: As per HPI.   O: Filed Vitals:   07/05/13 1315  BP: 131/88  Pulse: 79  Temp: 98.6 F (37 C)  Resp: 16   GEN: In NAD; WN,WD. HENT: [place]/AT; EOMMI w/ clear conj/sclerae. EACs/TMs normal. Nares/nasla septum w/o deformity. Post ph w/ midline uvula, no erythema or lesions.  NECK: Supple w/o LAN or TMG. COR: RRR. Normal S1 and S2. No m/g/r. Distal pulses intact. LUNGS: CTA; normal resp rate and effort. No wheezes, rhonchi or rales. SKIN: W&D; intact w/o diaphoresis, erythema, warmth or rashes. MS: MAEs; no muscle tenderness or atrophy. No c/c/e. NEURO: A&O x 3; CNs intact. Nonfocal.  ECG: NSR; rate= 55. No ST-TW changes. No ectopy.  [facility] reading (PRIMARY) by  Dr. Leward Quan: CXR- Normal heart size and clear lung fields w/ slightly increased peri-hilar markings.  A/P: Irregular heart beat - Plan: DG Chest 2 View, EKG 12-Lead, CBC with Differential, COMPLETE METABOLIC PANEL WITH GFR  Dyspnea - Plan: DG Chest 2 View, EKG 12-Lead, CBC with Differential, COMPLETE METABOLIC PANEL WITH GFR, Thyroid Panel With TSH  Hypertension - Plan: DG Chest 2 View, EKG 12-Lead, CBC with Differential, COMPLETE METABOLIC PANEL WITH GFR  Reactive airways dysfunction syndrome - Plan: DG Chest 2 View, EKG 12-Lead, CBC with Differential, COMPLETE METABOLIC PANEL WITH GFR,  Thyroid Panel With TSH

## 2013-07-06 LAB — COMPLETE METABOLIC PANEL WITH GFR
ALK PHOS: 68 U/L (ref 39–117)
ALT: 18 U/L (ref 0–35)
AST: 22 U/L (ref 0–37)
Albumin: 4.1 g/dL (ref 3.5–5.2)
BILIRUBIN TOTAL: 0.7 mg/dL (ref 0.2–1.2)
BUN: 11 mg/dL (ref 6–23)
CO2: 26 mEq/L (ref 19–32)
CREATININE: 0.78 mg/dL (ref 0.50–1.10)
Calcium: 9.7 mg/dL (ref 8.4–10.5)
Chloride: 103 mEq/L (ref 96–112)
GFR, Est Non African American: 79 mL/min
Glucose, Bld: 80 mg/dL (ref 70–99)
Potassium: 3.9 mEq/L (ref 3.5–5.3)
Sodium: 141 mEq/L (ref 135–145)
Total Protein: 7 g/dL (ref 6.0–8.3)

## 2013-07-06 LAB — THYROID PANEL WITH TSH
FREE THYROXINE INDEX: 2.4 (ref 1.0–3.9)
T3 Uptake: 30.8 % (ref 22.5–37.0)
T4, Total: 7.7 ug/dL (ref 5.0–12.5)
TSH: 1.177 u[IU]/mL (ref 0.350–4.500)

## 2013-07-09 ENCOUNTER — Encounter: Payer: Self-pay | Admitting: Family Medicine

## 2013-07-09 ENCOUNTER — Other Ambulatory Visit: Payer: Self-pay | Admitting: Family Medicine

## 2013-07-09 DIAGNOSIS — R0600 Dyspnea, unspecified: Secondary | ICD-10-CM

## 2013-07-09 DIAGNOSIS — I499 Cardiac arrhythmia, unspecified: Secondary | ICD-10-CM

## 2013-07-09 DIAGNOSIS — R001 Bradycardia, unspecified: Secondary | ICD-10-CM

## 2013-07-10 ENCOUNTER — Telehealth: Payer: Self-pay

## 2013-07-10 ENCOUNTER — Encounter: Payer: Self-pay | Admitting: Family Medicine

## 2013-07-10 NOTE — Telephone Encounter (Signed)
Sent message through My Chart

## 2013-07-10 NOTE — Telephone Encounter (Signed)
Dr.Mcpherson,  Pt would like to know if you could refer her to a cardiologist.

## 2013-07-11 ENCOUNTER — Encounter: Payer: Self-pay | Admitting: Family Medicine

## 2013-08-06 ENCOUNTER — Encounter: Payer: Self-pay | Admitting: Cardiology

## 2013-08-06 ENCOUNTER — Ambulatory Visit (INDEPENDENT_AMBULATORY_CARE_PROVIDER_SITE_OTHER): Payer: Medicare Other | Admitting: Cardiology

## 2013-08-06 VITALS — BP 156/81 | HR 84 | Ht 60.5 in | Wt 134.0 lb

## 2013-08-06 DIAGNOSIS — R079 Chest pain, unspecified: Secondary | ICD-10-CM

## 2013-08-06 DIAGNOSIS — R0989 Other specified symptoms and signs involving the circulatory and respiratory systems: Secondary | ICD-10-CM

## 2013-08-06 DIAGNOSIS — R0609 Other forms of dyspnea: Secondary | ICD-10-CM

## 2013-08-06 NOTE — Patient Instructions (Signed)
Your physician recommends that you continue on your current medications as directed. Please refer to the Current Medication list given to you today.  Your physician has requested that you have an echocardiogram. Echocardiography is a painless test that uses sound waves to create images of your heart. It provides your doctor with information about the size and shape of your heart and how well your heart's chambers and valves are working. This procedure takes approximately one hour. There are no restrictions for this procedure.  Your physician has requested that you have an exercise tolerance test. For further information please visit [email or web address]. Please also follow instruction sheet, as given.  Your physician recommends that you schedule a follow-up appointment as needed with Dr Turner 

## 2013-08-06 NOTE — Progress Notes (Signed)
[facility], [place], [place]  [address] Phone: [phone] Fax:  [phone]  Date:  08/06/2013   ID:  Rhonda Burns, DOB 22-Nov-1945, MRN [id number]  PCP:  Ellsworth Lennox, MD  Cardiologist:  Fransico Him, MD     History of Present Illness: Rhonda Burns is a 67 y.o. female with a history of HTN, dyslipidemia and GERD who presents today for evaluation of chest pains.  She gets the CP during the day but mostly at night.  She says it is a very mild pain that feels like a sharp pain that lasts for about 2 seconds and resolves.  She has not had the CP since March 15th.  It would come and go when she got it and usually about twice weekly for an instant.  She denied any SOB, nausea or diaphoresis with the CP.  She says that occasionally she would have a slight ache in her chest but has not had any since March 15th as well.  Her EKG done in March showed sinus bradycardia at 55bpm.  She denies any SOB, dizziness, palpitations or syncope.  She occasionally notices a skipped heart beat.  She does a lot of walking but when she goes up the stairs she gets SOB but nothing that makes her need to sit down.   Wt Readings from Last 3 Encounters:  07/05/13 137 lb (62.143 kg)  03/20/13 147 lb (66.679 kg)  03/19/13 140 lb 11.2 oz (63.821 kg)     Past Medical History  Diagnosis Date  . Hypertension   . Hyperlipidemia   . Osteoporosis   . Allergy   . Osteoporosis   . Allergic rhinitis, cause unspecified   . GERD (gastroesophageal reflux disease)   . Asthma     Current Outpatient Prescriptions  Medication Sig Dispense Refill  . albuterol (VENTOLIN HFA) 108 (90 BASE) MCG/ACT inhaler Inhale 2 puffs into the lungs every 6 (six) hours as needed for wheezing.  1 Inhaler  3  . aspirin 325 MG tablet Take 325 mg by mouth daily.      . calcium gluconate 500 MG tablet Take 500 mg by mouth daily.      Marland Kitchen EPINEPHrine (EPI-PEN) 0.3 mg/0.3 mL SOAJ injection Inject 0.3 mLs (0.3 mg total) into the muscle  once. As need for allergic reaction causing throat swelling  1 Device  0  . fish oil-omega-3 fatty acids 1000 MG capsule Take 1 g by mouth daily.       . fluticasone (FLONASE) 50 MCG/ACT nasal spray Place 2 sprays into the nose daily as needed for allergies.      Marland Kitchen lisinopril (PRINIVIL,ZESTRIL) 5 MG tablet Take 1 tablet (5 mg total) by mouth daily.  90 tablet  3  . meloxicam (MOBIC) 15 MG tablet Take 15 mg by mouth daily.      . pravastatin (PRAVACHOL) 40 MG tablet Take 1 tablet (40 mg total) by mouth daily.  90 tablet  3  . sulfamethoxazole-trimethoprim (SMZ-TMP DS) 800-160 MG per tablet Take 1 tablet by mouth 2 (two) times daily.  60 tablet  1   No current facility-administered medications for this visit.    Allergies:    Allergies  Allergen Reactions  . Other Swelling    Pecans and coconut    Social History:  The patient  reports that she has quit smoking. She does not have any smokeless tobacco history on file. She reports that she does not drink alcohol.   Family History:  The patient's family history includes Alzheimer's disease in her mother; Asthma in her sister.   ROS:  Please see the history of present illness.      All other systems reviewed and negative.   PHYSICAL EXAM: VS:  There were no vitals taken for this visit. Well nourished, well developed, in no acute distress HEENT: normal Neck: no JVD Cardiac:  normal S1, S2; RRR; no murmur Lungs:  clear to auscultation bilaterally, no wheezing, rhonchi or rales Abd: soft, nontender, no hepatomegaly Ext: no edema Skin: warm and dry Neuro:  CNs 2-12 intact, no focal abnormalities noted  EKG:  Sinus bradycardia with no ST changes on 07/05/2013     ASSESSMENT AND PLAN:  1. Atypical chest pain with normal EKG - she has a strong family history of CAD at an early age in her MGM and her dad died of an MI.  She has a history of HTN and dyslipidemia and she is a former smoker. - will get ETT to rule out ischemia 2. Asymptomatic  bradycardia 3. DOE when going up stairs - 2D echo to assess LVF 4.  HTN with borderline control - continue LIsinopril  Followup with me PRN pending results of studies   Signed, Fransico Him, MD 08/06/2013 3:13 PM

## 2013-08-12 ENCOUNTER — Ambulatory Visit (HOSPITAL_COMMUNITY): Payer: Medicare Other | Attending: Cardiology | Admitting: Cardiology

## 2013-08-12 DIAGNOSIS — R0609 Other forms of dyspnea: Secondary | ICD-10-CM | POA: Insufficient documentation

## 2013-08-12 DIAGNOSIS — R079 Chest pain, unspecified: Secondary | ICD-10-CM

## 2013-08-12 DIAGNOSIS — R0989 Other specified symptoms and signs involving the circulatory and respiratory systems: Secondary | ICD-10-CM | POA: Insufficient documentation

## 2013-08-12 NOTE — Progress Notes (Signed)
Echo performed. 

## 2013-08-21 ENCOUNTER — Ambulatory Visit (INDEPENDENT_AMBULATORY_CARE_PROVIDER_SITE_OTHER): Payer: Medicare Other | Admitting: Family Medicine

## 2013-08-21 VITALS — BP 128/78 | HR 72 | Temp 98.7°F | Resp 14 | Ht 59.5 in | Wt 134.8 lb

## 2013-08-21 DIAGNOSIS — M5412 Radiculopathy, cervical region: Secondary | ICD-10-CM

## 2013-08-21 DIAGNOSIS — L723 Sebaceous cyst: Secondary | ICD-10-CM

## 2013-08-21 DIAGNOSIS — M5010 Cervical disc disorder with radiculopathy, unspecified cervical region: Secondary | ICD-10-CM

## 2013-08-21 MED ORDER — PREDNISONE 20 MG PO TABS: ORAL_TABLET | ORAL | Status: DC

## 2013-08-21 NOTE — Progress Notes (Signed)
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  No recent trauma or change in activity.  No problem with movement of neck or arm.  The pain is intermittent and comes and goes spontaneously and remains for hours.  No numbness but from time to time the arm feels week. Aleve has not helped.  Patient is currently undergoing evaluation for her heart (bradycardia).  EKG and echo are normal at cardiology and a stress test has been ordered.   Also has persistent cysts on her back which are painful occasionally.  Objective:  NAD Neck:  Tender right base of cervical spine with palpation, normal ROM Shoulder:  FROM Reflexes:  Normal  Skin:  Two sebaceous cyst areas on back which are raised.  Assessment:  Cervical spine radiculopathy and sebaceous cysts. Please see notes above  .Cervical disc disorder with radiculopathy of cervical region - Plan: predniSONE (DELTASONE) 20 MG tablet  Sebaceous cyst  Signed, Robyn Haber, MD

## 2013-08-21 NOTE — Progress Notes (Signed)
Subjective:     Patient ID: Rhonda Burns, female   DOB: 09-15-1945, 68 y.o.   MRN: [id number]  HPI See below  Review of Systems     Objective:   Physical Exam    procedure note: Pt.'s to lesions on back were cleaned in a sterile fashion with alcohol and then injected with 2 % lidocaine with epinephrine for local anesthesia. The patient was then cleaned again with betadine and inspected to make sure the affected area was adequately numbed. One 6 mm punch biopsy was then conducted which removed majority of the 5 mm upper cyst and the rest of the was was removed with forceps and scissors. Afterworlds two simple interrupted stitches using 4.0 ethilon were used to close the wound. Next, a 5 cm elliptical incision was made on either side of the lower cyst and all of the cyst and cyst wall were removed. A 4.0 ethilon suture was then used putting in 6 stitches to close the lower wound. Pt. Did well with the procedure and had no complaints. Assessment:     See below    Plan:     See below

## 2013-08-21 NOTE — Progress Notes (Signed)
I was present with the student and assisted with the procedure as necessary.  The patient tolerated the procedure very well and was instructed in wound care.  Anticipate suture removal in 7-10 days.  Pt to RTC sooner if concerns   E. Natividad Brood MHS, PA-C [facility] 4/29/20152:06 PM

## 2013-08-22 ENCOUNTER — Telehealth (HOSPITAL_COMMUNITY): Payer: Self-pay

## 2013-08-26 ENCOUNTER — Ambulatory Visit: Payer: Medicare Other

## 2013-08-26 ENCOUNTER — Ambulatory Visit (INDEPENDENT_AMBULATORY_CARE_PROVIDER_SITE_OTHER): Payer: Medicare Other | Admitting: Family Medicine

## 2013-08-26 VITALS — BP 134/82 | HR 63 | Temp 97.8°F | Resp 16 | Ht 61.0 in | Wt 134.6 lb

## 2013-08-26 DIAGNOSIS — M5010 Cervical disc disorder with radiculopathy, unspecified cervical region: Secondary | ICD-10-CM

## 2013-08-26 DIAGNOSIS — M47812 Spondylosis without myelopathy or radiculopathy, cervical region: Secondary | ICD-10-CM

## 2013-08-26 DIAGNOSIS — M5412 Radiculopathy, cervical region: Secondary | ICD-10-CM

## 2013-08-26 DIAGNOSIS — M542 Cervicalgia: Secondary | ICD-10-CM

## 2013-08-26 MED ORDER — TRAMADOL HCL 50 MG PO TABS: 50.0000 mg | ORAL_TABLET | Freq: Three times a day (TID) | ORAL | Status: DC | PRN

## 2013-08-26 MED ORDER — DICLOFENAC SODIUM 75 MG PO TBEC: 75.0000 mg | DELAYED_RELEASE_TABLET | Freq: Two times a day (BID) | ORAL | Status: DC

## 2013-08-26 NOTE — Progress Notes (Signed)
Subjective: 68 year old lady who was seen here by Dr. Joseph Art last week. He assessed this as being a cervical radiculopathy from a possible disc, and treat her with a tapered dose of prednisone which she completed yesterday. She is continued to hurt a lot such as she's been taking Aleve approximately one pill every 6-8 hours or 3 in 24 hours. She's been wearing some pain patches which she purchased OTC. The pain has been in the neighborhood of 10 over 10, but right now she says it is about 8/10. She does not report any injuries that caused this. Started a couple of weeks ago and is gradually got very severe. She had some neck pain problems years ago, but no known injury to recent problems. She is retired, lives here with family in the area, and is a widow.  Objective: Fairly good range of motion of the neck though at virtually all extremes she has some pain. She is tender in the cervical spine in the mid cervical region and paraspinous muscles. She's tender in the right shoulder and upper arm. She has felt a pulsatile pain in the area. Still has function of her hands with adequate hand and arm strength and motion.  Assessment: Cervical pain with right cervical radiculopathy  Plan: C-spine series  [facility] reading (PRIMARY) by  Dr. Linna Darner Mid cervical spurring, degenerative changes worse lower than upper portions.  Treatment with diclofenac one twice daily Tramadol for pain.

## 2013-08-26 NOTE — Patient Instructions (Addendum)
Take the diclofenac one twice daily with breakfast and supper for pain and inflammation  Takes the tramadol one every 6 or 8 hours as needed for severe pain.  For mild pain take Tylenol 500 mg (acetaminophen) 2 tablets 3 times daily  Apply heat to the neck or ice and heat alternating  Do limbering exercises of your arm as discussed  Return at any time if acutely worse, but try to give it about 3 weeks or more if possible.

## 2013-08-27 ENCOUNTER — Ambulatory Visit (HOSPITAL_COMMUNITY)
Admission: RE | Admit: 2013-08-27 | Discharge: 2013-08-27 | Disposition: A | Discharge status: Home / Self Care | Payer: Medicare Other | Source: Ambulatory Visit | Attending: Cardiovascular Disease | Admitting: Cardiovascular Disease

## 2013-08-27 DIAGNOSIS — R079 Chest pain, unspecified: Secondary | ICD-10-CM | POA: Insufficient documentation

## 2013-08-31 ENCOUNTER — Ambulatory Visit (INDEPENDENT_AMBULATORY_CARE_PROVIDER_SITE_OTHER): Payer: Medicare Other | Admitting: Physician Assistant

## 2013-08-31 VITALS — BP 140/66 | HR 83 | Temp 98.0°F | Resp 17 | Ht 60.0 in | Wt 134.0 lb

## 2013-08-31 DIAGNOSIS — S21209A Unspecified open wound of unspecified back wall of thorax without penetration into thoracic cavity, initial encounter: Secondary | ICD-10-CM

## 2013-08-31 NOTE — Progress Notes (Signed)
   Subjective:    Patient ID: Odyssey Vasbinder, female    DOB: 06-27-1945, 68 y.o.   MRN: [id number]  HPI 68 year old female presents for suture removal. Had 2 sebaceous cysts removed on 08/21/13. States the wounds are doing well. No erythema, warmth, or drainage. They are slightly pruritic.  No other concerns today.     Review of Systems  Constitutional: Negative for fever and chills.  Skin: Positive for wound. Negative for color change.       Objective:   Physical Exam  HENT:  Head: Normocephalic and atraumatic.  Cardiovascular: Normal rate.   Pulmonary/Chest: Effort normal.  Skin:     Noted areas have 2 well healing wounds with sutures in place. No erythema, warmth, or drainage  Psychiatric: She has a normal mood and affect. Her behavior is normal. Judgment and thought content normal.     Sutures removed without difficulty. Patient tolerated well.       Assessment & Plan:  Wound of back  Sutures removed Wounds well healing Follow up as needed.

## 2013-09-06 ENCOUNTER — Telehealth: Payer: Self-pay | Admitting: Cardiology

## 2013-09-06 NOTE — Telephone Encounter (Signed)
I called pt and made aware I am waiting for Dr Radford Pax to tell me it is ok to schedule this for pt and if so for what diagnosis code

## 2013-09-06 NOTE — Telephone Encounter (Signed)
To Dr Radford Pax to advise? I was not aware of this?

## 2013-09-06 NOTE — Telephone Encounter (Signed)
Follow up     Pt calling back to get an appt for a nuclear stress test---but need order

## 2013-09-06 NOTE — Telephone Encounter (Signed)
New message     Pt had a treadmill test and said it was abnormal, she said Dr Radford Pax want her to have a nuclear stress test.  She is trying to get this scheduled but there is not order in the computer.  She want to talk to the nurse.  She will be going out of town for 6wks and want this test before she leaves

## 2013-09-07 NOTE — Telephone Encounter (Signed)
Patient's ETT was abnormal with 1mm of horizontal ST depression in Stage 4.  Please set up Stress myoview

## 2013-09-09 ENCOUNTER — Other Ambulatory Visit: Payer: Self-pay | Admitting: General Surgery

## 2013-09-09 DIAGNOSIS — R9439 Abnormal result of other cardiovascular function study: Secondary | ICD-10-CM

## 2013-09-09 NOTE — Telephone Encounter (Signed)
Order put in system for pt. Paper work filled out. Will call pt and go over directions prior to procedure once office opens.

## 2013-09-09 NOTE — Telephone Encounter (Signed)
Pt is aware. I went over all procedure instructions. Pt awaiting phone call from scheduling to make appt for test.

## 2013-09-11 ENCOUNTER — Encounter: Payer: Medicare Other | Admitting: Physician Assistant

## 2013-09-13 ENCOUNTER — Ambulatory Visit (INDEPENDENT_AMBULATORY_CARE_PROVIDER_SITE_OTHER): Payer: Medicare Other | Admitting: Family Medicine

## 2013-09-13 ENCOUNTER — Other Ambulatory Visit: Payer: Self-pay

## 2013-09-13 ENCOUNTER — Encounter: Payer: Self-pay | Admitting: Family Medicine

## 2013-09-13 VITALS — BP 134/77 | HR 71 | Temp 98.1°F | Resp 16 | Ht 60.5 in | Wt 135.0 lb

## 2013-09-13 DIAGNOSIS — M542 Cervicalgia: Secondary | ICD-10-CM

## 2013-09-13 DIAGNOSIS — R931 Abnormal findings on diagnostic imaging of heart and coronary circulation: Secondary | ICD-10-CM

## 2013-09-13 DIAGNOSIS — M47812 Spondylosis without myelopathy or radiculopathy, cervical region: Secondary | ICD-10-CM

## 2013-09-13 DIAGNOSIS — M7520 Bicipital tendinitis, unspecified shoulder: Secondary | ICD-10-CM

## 2013-09-13 DIAGNOSIS — M7521 Bicipital tendinitis, right shoulder: Secondary | ICD-10-CM

## 2013-09-13 MED ORDER — TRAMADOL HCL 50 MG PO TABS: 50.0000 mg | ORAL_TABLET | Freq: Three times a day (TID) | ORAL | Status: DC | PRN

## 2013-09-13 NOTE — Telephone Encounter (Signed)
I have ordered the stress test and sent a message to [facility] asking them to call pt asap to schedule stress test.

## 2013-09-13 NOTE — Patient Instructions (Addendum)
Bicipital Tendonitis Bicipital tendonitis refers to redness, soreness, and swelling (inflammation) or irritation of the bicep tendon. The biceps muscle is located between the elbow and shoulder of the inner arm. The tendon heads, similar to pieces of rope, connect the bicep muscle to the shoulder socket. They are called short head and long head tendons. When tendonitis occurs, the long head tendon is inflamed and swollen, and may be thickened or partially torn.  Bicipital tendonitis can occur with other problems as well, such as arthritis in the shoulder or acromioclavicular joints, tears in the tendons, or other rotator cuff problems.  CAUSES  Overuse of the arms for overhead activities is the major cause of tendonitis. Many athletes, such as swimmers, baseball players, and tennis players are prone to bicipital tendonitis. Jobs that require manual labor or routine chores, especially chores involving overhead activities can result in overuse and tendonitis. SYMPTOMS Symptoms may include:  Pain in and around the front of the shoulder. Pain may be worse with overhead motion.  Pain or aching that radiates down the arm.  Clicking or shifting sensations in the shoulder. DIAGNOSIS Your caregiver may perform the following:  Physical exam and tests of the biceps and shoulder to observe range of motion, strength, and stability.  X-rays or magnetic resonance imaging (MRI) to confirm the diagnosis. In most common cases, these tests are not necessary. Since other problems may exist in the shoulder or rotator cuff, additional tests may be recommended. TREATMENT Treatment may include the following:  Medications  Your caregiver may prescribe over-the-counter pain relievers.  Steroid injections, such as cortisone, may be recommended. These may help to reduce inflammation and pain.  Physical Therapy - Your caregiver may recommend gentle exercises with the arm. These can help restore strength and range of  motion. They may be done at home or with a physical therapist's supervision and input.  Surgery - Arthroscopic or open surgery sometimes is necessary. Surgery may include:  Reattachment or repair of the tendon at the shoulder socket.  Removal of the damaged section of the tendon.  Anchoring the tendon to a different area of the shoulder (tenodesis). HOME CARE INSTRUCTIONS   Avoid overhead motion of the affected arm or any other motion that causes pain.  Take medication for pain as directed. Do not take these for more than 3 weeks, unless directed to do so by your caregiver.  Ice the affected area for 20 minutes at a time, 3-4 times per day. Place a towel on the skin over the painful area and the ice or cold pack over the towel. Do not place ice directly on the skin.  Perform gentle exercises at home as directed. These will increase strength and flexibility. PREVENTION  Modify your activities as much as possible to protect your arm. A physical therapist or sports medicine physician can help you understand options for safe motion.  Avoid repetitive overhead pulling, lifting, reaching, and throwing until your caregiver tells you it is ok to resume these activities. SEEK MEDICAL CARE IF:  Your pain worsens.  You have difficulty moving the affected arm.  You have trouble performing any of the self-care instructions. MAKE SURE YOU:   Understand these instructions.  Will watch your condition.  Will get help right away if you are not doing well or get worse. Document Released: 05/14/2010 Document Revised: 07/04/2011 Document Reviewed: 05/14/2010 [employer] Patient Information 2014 [employer].    TAKE DICLOFENAC TWICE A DAY WITH FOOD OR SNACK.  You can take TRAMADOL  every 8 hours as needed for more severe pain; this medication may make you drowsy.   ARNICA GEL is available without a prescription and is good for joint and muscle aches and pains.  Try to limit your activity to  give your shoulder time to get better.

## 2013-09-13 NOTE — Telephone Encounter (Signed)
New message    Waiting on to be schedule stress test.    Patient stated she spoken with nurse regarding order put into the system

## 2013-09-15 NOTE — Progress Notes (Signed)
S:  This 68 y.o. AA female returns for recheck of R shoulder pain. She has known mild C-spine DDD (xray reviewed w/ pt). She c/o pain in lateral aspect of R upper arm; pain is mild to moderate and only slightly increase with movement. ROM is not decreased. She states she has "popping" but this is a sensation she reproduces with manipulation of biceps muscles. She has no numbness or weakness. Pain is not interrupting sleep. Pt took steroids in November 2014 and then again about 1 month ago. They provided temporary relief. She has been taking Diclofenac only as needed, less than once a day. Not really using Tramadol.  Pt has follow-up cardiac imaging study scheduled for next week. She denies diaphoresis, fatigue, CP or tightness, palpitations, SOB or DOE, cough, HA, dizziness, weakness or syncope. She feels well but wants to be certain she has no undiagnosed cardiac issue. Until recently, she was very active and exercised at least 5 days/ week.  Patient Active Problem List   Diagnosis Date Noted  . Chest pain 08/06/2013  . DOE (dyspnea on exertion) 08/06/2013  . Reactive airways dysfunction syndrome 09/27/2012  . Hypertension   . Hyperlipidemia   . Osteoporosis   . Allergic rhinitis, cause unspecified   . GERD (gastroesophageal reflux disease)    Prior to Admission medications   Medication Sig Start Date End Date Taking? Authorizing Provider  albuterol (VENTOLIN HFA) 108 (90 BASE) MCG/ACT inhaler Inhale 2 puffs into the lungs every 6 (six) hours as needed for wheezing. 09/27/12 09/27/13 Yes Barton Fanny, MD  aspirin 325 MG tablet Take 325 mg by mouth daily.   Yes Historical Provider, MD  calcium gluconate 500 MG tablet Take 500 mg by mouth daily.   Yes Historical Provider, MD  diclofenac (VOLTAREN) 75 MG EC tablet Take 1 tablet (75 mg total) by mouth 2 (two) times daily.   Yes Posey Boyer, MD  fish oil-omega-3 fatty acids 1000 MG capsule Take 1 g by mouth daily.    Yes Historical Provider,  MD  fluticasone (FLONASE) 50 MCG/ACT nasal spray Place 2 sprays into the nose daily as needed for allergies. 09/27/12  Yes Barton Fanny, MD  lisinopril (PRINIVIL,ZESTRIL) 5 MG tablet Take 1 tablet (5 mg total) by mouth daily. 09/27/12  Yes Barton Fanny, MD  pravastatin (PRAVACHOL) 40 MG tablet Take 1 tablet (40 mg total) by mouth daily. 09/27/12  Yes Barton Fanny, MD  sulfamethoxazole-trimethoprim (BACTRIM DS,SEPTRA DS) 800-160 MG per tablet Take 1 tablet by mouth as needed. 03/20/13  Yes Barton Fanny, MD  traMADol (ULTRAM) 50 MG tablet Take 1 tablet (50 mg total) by mouth every 8 (eight) hours as needed.   Yes Barton Fanny, MD   PMHx, Surg Hx, Soc and Fam Hx reviewed.  ROS; As per HPI.  O: Filed Vitals:   09/13/13 1351  BP: 134/77  Pulse: 71  Temp: 98.1 F (36.7 C)  Resp: 16   GEN: In NAD: WN,WD. HENT: [place]/AT; EOMI w/ clear conj/sclerae. Otherwise unremarkable. NECK: Decreased ROM; mild spasms posterior aspect. COR: RRR. No edema. LUNGS: Unlabored resp. SKIN: W&D; intact w/o diaphoresis or erythema. MS: R shoulder- FROM; point tenderness over bicipital tendon. No effusion or crepitus. Normal motor function- biceps strength 4+/5; grip 4+/= in both hands. NEURO: A&O x 3; CNs intact. Gait is normal. Coordination is normal.  Nonfocal.  A/P: Bicipital tendinitis of right shoulder- Diclofenac 1 tablet bid every day; advised to  moderate activity (she  is going to visit her daughter; I have advised that she do only what is needed for her ADLs).  Cervical pain - Plan: traMADol (ULTRAM) 50 MG tablet; she can take this medication hs. Use topical analgesic on shoulder and neck.  Cervical arthritis - Plan: traMADol (ULTRAM) 50 MG tablet

## 2013-09-17 ENCOUNTER — Ambulatory Visit (HOSPITAL_COMMUNITY): Payer: Medicare Other | Attending: Cardiology | Admitting: Radiology

## 2013-09-17 VITALS — BP 176/91 | HR 64 | Ht 60.0 in | Wt 133.0 lb

## 2013-09-17 DIAGNOSIS — R0602 Shortness of breath: Secondary | ICD-10-CM

## 2013-09-17 DIAGNOSIS — R0609 Other forms of dyspnea: Secondary | ICD-10-CM | POA: Insufficient documentation

## 2013-09-17 DIAGNOSIS — R079 Chest pain, unspecified: Secondary | ICD-10-CM | POA: Insufficient documentation

## 2013-09-17 DIAGNOSIS — R931 Abnormal findings on diagnostic imaging of heart and coronary circulation: Secondary | ICD-10-CM

## 2013-09-17 DIAGNOSIS — R0989 Other specified symptoms and signs involving the circulatory and respiratory systems: Secondary | ICD-10-CM | POA: Insufficient documentation

## 2013-09-17 MED ORDER — TECHNETIUM TC 99M SESTAMIBI GENERIC - CARDIOLITE
30.0000 | Freq: Once | INTRAVENOUS | Status: AC | PRN
  Administered 2013-09-17: 30 via INTRAVENOUS

## 2013-09-17 MED ORDER — TECHNETIUM TC 99M SESTAMIBI GENERIC - CARDIOLITE
10.0000 | Freq: Once | INTRAVENOUS | Status: AC | PRN
  Administered 2013-09-17: 10 via INTRAVENOUS

## 2013-09-17 NOTE — Progress Notes (Signed)
Rhonda Burns, [place] [address] [phone]    Cardiology Nuclear Med Study  Rhonda Burns is a 68 y.o. female     MRN : [id number]     DOB: 10/31/1945  Procedure Date: 09/17/2013  Nuclear Med Background Indication for Stress Test:  Evaluation for Ischemia and Abnormal EKG/GXT 08/27/13 with 62mm ST depression History:  No known CAD, Echo 2015 EF 60-65%, Asthma Cardiac Risk Factors: Family History - CAD, History of Smoking, Hypertension and Lipids  Symptoms:  Chest Pain and DOE   Nuclear Pre-Procedure Caffeine/Decaff Intake:  None NPO After: 5:00am   Lungs:  clear O2 Sat: 98% on room air. IV 0.9% NS with Angio Cath:  22g  IV Site: R Hand  IV Started by:  Matilde Haymaker, RN  Chest Size (in):  32 Cup Size: B  Height: 5' (1.524 m)  Weight:  133 lb (60.328 kg)  BMI:  Body mass index is 25.97 kg/(m^2). Tech Comments:  n/a    Nuclear Med Study 1 or 2 day study: 1 day  Stress Test Type:  Stress  Reading MD: n/a  Order Authorizing Provider:  Tressia Miners Turner,MD  Resting Radionuclide: Technetium 87m Sestamibi  Resting Radionuclide Dose: 11.0 mCi   Stress Radionuclide:  Technetium 87m Sestamibi  Stress Radionuclide Dose: 33.0 mCi           Stress Protocol Rest HR: 64 Stress HR: 131  Rest BP: 176/91 Stress BP: 236/65  Exercise Time (min): 4:16 METS: 6.1           Dose of Adenosine (mg):  n/a Dose of Lexiscan: n/a mg  Dose of Atropine (mg): n/a Dose of Dobutamine: n/a mcg/kg/min (at max HR)  Stress Test Technologist: Glade Lloyd, BS-ES  Nuclear Technologist:  Charlton Amor, CNMT     Rest Procedure:  Myocardial perfusion imaging was performed at rest 45 minutes following the intravenous administration of Technetium 37m Sestamibi. Rest ECG: NSR - Normal EKG  Stress Procedure:  The patient exercised on the treadmill utilizing the Bruce Protocol for 4:16 minutes. The patient stopped due to elevated BP and denied any chest pain.   Technetium 88m Sestamibi was injected at peak exercise and myocardial perfusion imaging was performed after a brief delay. Stress ECG: No significant change from baseline ECG  QPS Raw Data Images:  There is a breast shadow that accounts for the anterior attenuation. Stress Images:  There is mild attenuation of the distal anterior wall and apex with normal uptake in the other regions.  Rest Images:  There is mild attenuation of the distal anterior wall and apex with normal uptake in the other regions. Subtraction (SDS):  No evidence of ischemia. Transient Ischemic Dilatation (Normal <1.22):  1.00 Lung/Heart Ratio (Normal <0.45):  0.31  Quantitative Gated Spect Images QGS EDV:  67 ml QGS ESV:  20 ml  Impression Exercise Capacity:  Fair exercise capacity. BP Response:  Hypertensive blood pressure response. Clinical Symptoms:  No significant symptoms noted. ECG Impression:  No significant ST segment change suggestive of ischemia. Comparison with Prior Nuclear Study: No previous nuclear study performed  Overall Impression:  Normal stress nuclear study. no evidence of ischemia.  There is mild breast attenuation.    LV Ejection Fraction: 70%.  LV Wall Motion:  NL LV Function; NL Wall Motion.   Thayer Headings, Brooke Bonito., MD, [facility] 09/17/2013, 4:39 PM [address]  [place] Pager [phone]

## 2013-10-03 ENCOUNTER — Encounter: Payer: MEDICARE | Admitting: Family Medicine

## 2013-11-12 LAB — HM MAMMOGRAPHY

## 2013-11-19 ENCOUNTER — Ambulatory Visit (INDEPENDENT_AMBULATORY_CARE_PROVIDER_SITE_OTHER): Payer: Medicare Other | Admitting: Family Medicine

## 2013-11-19 ENCOUNTER — Encounter: Payer: Self-pay | Admitting: Family Medicine

## 2013-11-19 VITALS — BP 130/70 | HR 73 | Temp 98.2°F | Resp 16 | Ht 60.5 in | Wt 137.0 lb

## 2013-11-19 DIAGNOSIS — J45909 Unspecified asthma, uncomplicated: Secondary | ICD-10-CM

## 2013-11-19 DIAGNOSIS — J683 Other acute and subacute respiratory conditions due to chemicals, gases, fumes and vapors: Secondary | ICD-10-CM

## 2013-11-19 DIAGNOSIS — I10 Essential (primary) hypertension: Secondary | ICD-10-CM

## 2013-11-19 DIAGNOSIS — Z760 Encounter for issue of repeat prescription: Secondary | ICD-10-CM

## 2013-11-19 MED ORDER — PRAVASTATIN SODIUM 40 MG PO TABS: 40.0000 mg | ORAL_TABLET | Freq: Every day | ORAL | Status: DC

## 2013-11-19 MED ORDER — ALBUTEROL SULFATE HFA 108 (90 BASE) MCG/ACT IN AERS: 2.0000 | INHALATION_SPRAY | Freq: Four times a day (QID) | RESPIRATORY_TRACT | Status: DC | PRN

## 2013-11-19 MED ORDER — LISINOPRIL 5 MG PO TABS: 5.0000 mg | ORAL_TABLET | Freq: Every day | ORAL | Status: DC

## 2013-11-19 NOTE — Progress Notes (Signed)
S:  This 68 y.o. AA female has well controlled HTN and lipid disorder. Medication compliance is excellent and there are no adverse effects. She needs medications refills. Walking is fitness activity. Pt denies fatigue, diaphoresis, abnormal weight change, vision disturbance, CP or tightness, palpitations, SOB or DOE, cough, edema, HA, dizziness, numbness weakness or syncope.   Patient Active Problem List   Diagnosis Date Noted  . Chest pain 08/06/2013  . DOE (dyspnea on exertion) 08/06/2013  . Reactive airways dysfunction syndrome 09/27/2012  . Hypertension   . Hyperlipidemia   . Osteoporosis   . Allergic rhinitis, cause unspecified   . GERD (gastroesophageal reflux disease)     Prior to Admission medications   Medication Sig Start Date End Date Taking? Authorizing Provider  aspirin 325 MG tablet Take 325 mg by mouth daily.   Yes Historical Provider, MD  calcium gluconate 500 MG tablet Take 500 mg by mouth daily.   Yes Historical Provider, MD  diclofenac (VOLTAREN) 75 MG EC tablet Take 1 tablet (75 mg total) by mouth 2 (two) times daily.   Yes Posey Boyer, MD  fish oil-omega-3 fatty acids 1000 MG capsule Take 1 g by mouth daily.    Yes Historical Provider, MD  fluticasone (FLONASE) 50 MCG/ACT nasal spray Place 2 sprays into the nose daily as needed for allergies. 09/27/12  Yes Barton Fanny, MD  lisinopril (PRINIVIL,ZESTRIL) 5 MG tablet Take 1 tablet (5 mg total) by mouth daily.   Yes Barton Fanny, MD  pravastatin (PRAVACHOL) 40 MG tablet Take 1 tablet (40 mg total) by mouth daily.   Yes Barton Fanny, MD  traMADol (ULTRAM) 50 MG tablet Take 1 tablet (50 mg total) by mouth every 8 (eight) hours as needed. 09/13/13  Yes Barton Fanny, MD  albuterol (VENTOLIN HFA) 108 (90 BASE) MCG/ACT inhaler Inhale 2 puffs into the lungs every 6 (six) hours as needed for wheezing.    Barton Fanny, MD    PMHx, Surg Hx, Soc and Fam Hx reviewed.  ROS: AS per HPI.  O: Filed  Vitals:   11/19/13 1615  BP: 130/70  Pulse: 73  Temp: 98.2 F (36.8 C)  Resp: 16   GEN: in NAD; WN,WD. HENT: [place]/AT; EOMI w/ clear conj/sclerae. Otherwise unremarkable. COR: RRR. LUNGS: Unlabored resp. SKIN: W&D; intact w/o diaphoresis. MS: MAEs; no deformities or c/c/e. NEURO: A&O x 3; CNs intact. Nonfocal.  A/P: Essential hypertension  Reactive airways dysfunction syndrome, unspecified asthma severity, uncomplicated - Plan: albuterol (VENTOLIN HFA) 108 (90 BASE) MCG/ACT inhaler; pt reports that she rarely uses this MDI but would like to have it "just in case".  Issue of repeat prescriptions  Meds ordered this encounter      . lisinopril (PRINIVIL,ZESTRIL) 5 MG tablet    Sig: Take 1 tablet (5 mg total) by mouth daily.    Dispense:  90 tablet    Refill:  3  . pravastatin (PRAVACHOL) 40 MG tablet    Sig: Take 1 tablet (40 mg total) by mouth daily.    Dispense:  90 tablet    Refill:  3  . albuterol (VENTOLIN HFA) 108 (90 BASE) MCG/ACT inhaler    Sig: Inhale 2 puffs into the lungs every 6 (six) hours as needed for wheezing.    Dispense:  1 Inhaler    Refill:  3

## 2013-11-22 ENCOUNTER — Encounter: Payer: Self-pay | Admitting: *Deleted

## 2014-03-07 ENCOUNTER — Encounter: Payer: Self-pay | Admitting: Family Medicine

## 2014-03-07 ENCOUNTER — Ambulatory Visit (INDEPENDENT_AMBULATORY_CARE_PROVIDER_SITE_OTHER): Payer: Medicare Other | Admitting: Family Medicine

## 2014-03-07 VITALS — BP 118/60 | HR 78 | Temp 98.3°F | Resp 16 | Ht 61.0 in | Wt 138.0 lb

## 2014-03-07 DIAGNOSIS — I10 Essential (primary) hypertension: Secondary | ICD-10-CM | POA: Diagnosis not present

## 2014-03-07 DIAGNOSIS — E785 Hyperlipidemia, unspecified: Secondary | ICD-10-CM

## 2014-03-07 DIAGNOSIS — Z01419 Encounter for gynecological examination (general) (routine) without abnormal findings: Secondary | ICD-10-CM

## 2014-03-07 DIAGNOSIS — Z124 Encounter for screening for malignant neoplasm of cervix: Secondary | ICD-10-CM | POA: Diagnosis not present

## 2014-03-07 DIAGNOSIS — H6123 Impacted cerumen, bilateral: Secondary | ICD-10-CM

## 2014-03-07 DIAGNOSIS — Z0000 Encounter for general adult medical examination without abnormal findings: Secondary | ICD-10-CM

## 2014-03-07 LAB — BASIC METABOLIC PANEL WITH GFR
BUN: 15 mg/dL (ref 6–23)
CO2: 27 mEq/L (ref 19–32)
Calcium: 9.7 mg/dL (ref 8.4–10.5)
Chloride: 105 mEq/L (ref 96–112)
Creat: 0.8 mg/dL (ref 0.50–1.10)
GFR, EST AFRICAN AMERICAN: 88 mL/min
GFR, Est Non African American: 76 mL/min
Glucose, Bld: 92 mg/dL (ref 70–99)
POTASSIUM: 3.8 meq/L (ref 3.5–5.3)
Sodium: 143 mEq/L (ref 135–145)

## 2014-03-07 LAB — POCT URINALYSIS DIPSTICK
Bilirubin, UA: NEGATIVE
GLUCOSE UA: NEGATIVE
Ketones, UA: NEGATIVE
Leukocytes, UA: NEGATIVE
NITRITE UA: NEGATIVE
PH UA: 5
Protein, UA: NEGATIVE
RBC UA: NEGATIVE
Spec Grav, UA: 1.015
Urobilinogen, UA: 0.2

## 2014-03-07 LAB — LIPID PANEL
Cholesterol: 144 mg/dL (ref 0–200)
HDL: 53 mg/dL (ref >39)
LDL CALC: 60 mg/dL (ref 0–99)
Total CHOL/HDL Ratio: 2.7 Ratio
Triglycerides: 155 mg/dL — ABNORMAL HIGH (ref <150)
VLDL: 31 mg/dL (ref 0–40)

## 2014-03-07 MED ORDER — LISINOPRIL 2.5 MG PO TABS: ORAL_TABLET | ORAL | Status: DC

## 2014-03-07 NOTE — Patient Instructions (Signed)
Keeping You Healthy  Get These Tests  Blood Pressure- Have your blood pressure checked by your healthcare provider at least once a year.  Normal blood pressure is 120/80.  Weight- Have your body mass index (BMI) calculated to screen for obesity.  BMI is a measure of body fat based on height and weight.  You can calculate your own BMI at [employer]  Cholesterol- Have your cholesterol checked every year.  Diabetes- Have your blood sugar checked every year if you have high blood pressure, high cholesterol, a family history of diabetes or if you are overweight.  Pap Smear- Have a pap smear every 1 to 3 years if you have been sexually active.  If you are older than 65 and recent pap smears have been normal you may not need additional pap smears.  In addition, if you have had a hysterectomy  For benign disease additional pap smears are not necessary.  Mammogram-Yearly mammograms are essential for early detection of breast cancer  Screening for Colon Cancer- Colonoscopy starting at age 40. Screening may begin sooner depending on your family history and other health conditions.  Follow up colonoscopy as directed by your Gastroenterologist.  Screening for Osteoporosis- Screening begins at age 62 with bone density scanning, sooner if you are at higher risk for developing Osteoporosis.  Get these medicines  Calcium with Vitamin D- Your body requires 1200-1500 mg of Calcium a day and [phone] IU of Vitamin D a day.  You can only absorb 500 mg of Calcium at a time therefore Calcium must be taken in 2 or 3 separate doses throughout the day.  Hormones- Hormone therapy has been associated with increased risk for certain cancers and heart disease.  Talk to your healthcare provider about if you need relief from menopausal symptoms.  Aspirin- Ask your healthcare provider about taking Aspirin to prevent Heart Disease and Stroke.  Get these Immuniztions  Flu shot- Every fall You have declined  this vaccine.  Pneumonia shot- Once after the age of 38; if you are younger ask your healthcare provider if you need a pneumonia shot. Please return next week for Pneumovax (PPVS23); it will be 5 years since last vaccination on 03/10/2014.  Tetanus- Every ten years. This vaccine is current.  Zostavax- Once after the age of 67 to prevent shingles. You have had this vaccine 09/28/2012.  Take these steps  Don't smoke- Your healthcare provider can help you quit. For tips on how to quit, ask your healthcare provider or go to www.smokefree.gov or call 1-800 QUIT-NOW.  Be physically active- Exercise 5 days a week for a minimum of 30 minutes.  If you are not already physically active, start slow and gradually work up to 30 minutes of moderate physical activity.  Try walking, dancing, bike riding, swimming, etc.  Eat a healthy diet- Eat a variety of healthy foods such as fruits, vegetables, whole grains, low fat milk, low fat cheeses, yogurt, lean meats, chicken, fish, eggs, dried beans, tofu, etc.  For more information go to www.thenutritionsource.org  Dental visit- Brush and floss teeth twice daily; visit your dentist twice a year.  Eye exam- Visit your Optometrist or Ophthalmologist yearly.  Drink alcohol in moderation- Limit alcohol intake to one drink or less a day.  Never drink and drive.  Depression- Your emotional health is as important as your physical health.  If you're feeling down or losing interest in things you normally enjoy, please talk to your healthcare provider.  Seat Belts- can save your life;  always wear one  Smoke/Carbon Monoxide detectors- These detectors need to be installed on the appropriate level of your home.  Replace batteries at least once a year.  Violence- If anyone is threatening or hurting you, please tell your healthcare provider.  Living Will/ Health care power of attorney- Discuss with your healthcare provider and family.   I have decreased your blood  pressure medication to Lisinopril 2.5 mg  1 tablet daily. Have your blood pressure checked at a local pharmacy while you are away if you can.  I will see you again in 4 months.  Enjoy the Holidays and have safe travels!

## 2014-03-07 NOTE — Progress Notes (Signed)
Subjective:    Patient ID: Rhonda Burns, female    DOB: [birth year], 68 y.o.   MRN: [id number]  HPI  This 68 y.o. AA female is here for [facility] Subsequent Wellness exam. She has well controlled HTN and is compliant w/ medication. Home BP readings= 110-130/60-80. She reports that she has episodes of dizziness  And wants to discuss BP medication dose. Her other concern in a chronic knot in L armpit; she has hx of excision of infected cyst in L axilla.  Pt will be traveling to [place] for 2 months to spend the [place] w/ family and friends.  HCM: PAP- 3 years ago (negative).           MMG- Current.           CRS- Current (normal).           IMM- Current; declines Flu vaccine and Prevnar13. Will return next week for Pneumovax.           DEXA- 2013 (normal).   Patient Active Problem List   Diagnosis Date Noted  . Chest pain 08/06/2013  . DOE (dyspnea on exertion) 08/06/2013  . Reactive airways dysfunction syndrome 09/27/2012  . Hypertension   . Hyperlipidemia   . Osteoporosis   . Allergic rhinitis, cause unspecified   . GERD (gastroesophageal reflux disease)     Prior to Admission medications   Medication Sig Start Date End Date Taking? Authorizing Provider  albuterol (VENTOLIN HFA) 108 (90 BASE) MCG/ACT inhaler Inhale 2 puffs into the lungs every 6 (six) hours as needed for wheezing. 11/19/13 11/19/14 Yes Barton Fanny, MD  aspirin 325 MG tablet Take 325 mg by mouth daily.   Yes Historical Provider, MD  calcium gluconate 500 MG tablet Take 500 mg by mouth daily.   Yes Historical Provider, MD  diclofenac (VOLTAREN) 75 MG EC tablet Take 1 tablet (75 mg total) by mouth 2 (two) times daily. 08/26/13  Yes Posey Boyer, MD  fish oil-omega-3 fatty acids 1000 MG capsule Take 1 g by mouth daily.    Yes Historical Provider, MD  fluticasone (FLONASE) 50 MCG/ACT nasal spray Place 2 sprays into the nose daily as needed for allergies. 09/27/12  Yes Barton Fanny, MD  lisinopril  (PRINIVIL,ZESTRIL) 5 MG tablet Take 1 tablet by mouth daily to lower blood pressure. 11/19/13  Yes Barton Fanny, MD  pravastatin (PRAVACHOL) 40 MG tablet Take 1 tablet (40 mg total) by mouth daily. 11/19/13  Yes Barton Fanny, MD  traMADol (ULTRAM) 50 MG tablet Take 1 tablet (50 mg total) by mouth every 8 (eight) hours as needed. 09/13/13  Yes Barton Fanny, MD    History   Social History  . Marital Status: Unknown    Spouse Name: N/A    Number of Children: N/A  . Years of Education: N/A   Occupational History  . Not on file.   Social History Main Topics  . Smoking status: Former Research scientist (life sciences)  . Smokeless tobacco: Not on file  . Alcohol Use: No  . Drug Use: No  . Sexual Activity: Not on file   Other Topics Concern  . Not on file   Social History Narrative   Exercise walking 7 days per week for 1 hour    Family History  Problem Relation Age of Onset  . Alzheimer's disease Mother   . Heart disease Mother   . Stroke Mother   . Asthma Sister   . Heart disease Father   .  Heart attack Father   . Diabetes Father     Review of Systems  Constitutional: Negative.   HENT: Negative.   Eyes: Positive for itching.  Respiratory: Positive for cough.   Cardiovascular: Negative.   Gastrointestinal: Positive for abdominal pain.  Endocrine: Negative.   Genitourinary: Negative.   Musculoskeletal: Negative.   Allergic/Immunologic: Negative.   Neurological: Positive for dizziness.  Hematological: Negative.   Psychiatric/Behavioral: Negative.       Objective:   Physical Exam  Constitutional: She is oriented to person, place, and time. Vital signs are normal. She appears well-developed and well-nourished. No distress.  HENT:  Head: Normocephalic and atraumatic.  Right Ear: Hearing and external ear normal.  Left Ear: Hearing and external ear normal.  Nose: Nose normal. No mucosal edema, septal deviation or nasal septal hematoma.  Mouth/Throat: Uvula is midline,  oropharynx is clear and moist and mucous membranes are normal. No oral lesions. Normal dentition.  Bilateral cerumen impactions.  Eyes: Conjunctivae, EOM and lids are normal. Pupils are equal, round, and reactive to light. No scleral icterus.  Pt has periodic eye and vision eval w/ a specialist.  Neck: Trachea normal, normal range of motion, full passive range of motion without pain and phonation normal. Neck supple. No JVD present. No spinous process tenderness and no muscular tenderness present. Carotid bruit is not present. No thyroid mass and no thyromegaly present.  Cardiovascular: Normal rate, regular rhythm, S1 normal, S2 normal, normal heart sounds, intact distal pulses and normal pulses.   No extrasystoles are present. PMI is not displaced.  Exam reveals no gallop and no friction rub.   No murmur heard. Pulmonary/Chest: Effort normal and breath sounds normal. No respiratory distress. She has no decreased breath sounds. She has no wheezes. Right breast exhibits no inverted nipple, no mass, no nipple discharge, no skin change and no tenderness. Left breast exhibits no inverted nipple, no mass, no nipple discharge, no skin change and no tenderness. Breasts are symmetrical.  Abdominal: Soft. Normal appearance and bowel sounds are normal. She exhibits no distension, no abdominal bruit, no pulsatile midline mass and no mass. There is no hepatosplenomegaly. There is no tenderness. There is no guarding and no CVA tenderness.  Genitourinary: Uterus normal. There is no rash, tenderness or lesion on the right labia. There is no rash, tenderness or lesion on the left labia. Cervix exhibits no motion tenderness, no discharge and no friability. Right adnexum displays no mass, no tenderness and no fullness. Left adnexum displays no mass, no tenderness and no fullness. No erythema or tenderness in the vagina. No signs of injury around the vagina. No vaginal discharge found.  Vaginal mucosa dry and atrophic;  cervical os stenosed.  Musculoskeletal:       Cervical back: Normal.       Thoracic back: Normal.       Lumbar back: Normal.  Remainder of exam unremarkable.  Lymphadenopathy:       Head (right side): No submental, no submandibular, no tonsillar, no preauricular, no posterior auricular and no occipital adenopathy present.       Head (left side): No submental, no submandibular, no tonsillar, no preauricular, no posterior auricular and no occipital adenopathy present.    She has no cervical adenopathy.    She has no axillary adenopathy.       Right: No inguinal and no supraclavicular adenopathy present.       Left: No inguinal and no supraclavicular adenopathy present.  Neurological: She is alert and oriented  to person, place, and time. She has normal strength and normal reflexes. She displays no atrophy and no tremor. No cranial nerve deficit or sensory deficit. She exhibits normal muscle tone. She displays a negative Romberg sign. Coordination and gait normal.  Get Up and Go test: time= 8-9 sec.  Skin: Skin is warm, dry and intact. No ecchymosis, no lesion and no rash noted. She is not diaphoretic. No cyanosis or erythema. Nails show no clubbing.  Psychiatric: She has a normal mood and affect. Her speech is normal and behavior is normal. Judgment and thought content normal. Cognition and memory are normal.  Nursing note and vitals reviewed.     Assessment & Plan:  Medicare annual wellness visit, subsequent  Encounter for cervical Pap smear with pelvic exam - Plan: Pap IG (Image Guided)  Essential hypertension - Stable but I have concerns about dizziness which may be due to hypotension. Will reduce Lisinopril to 2.5 mg 1 tablet daily. Pt to continue monitoring home BP. Plan: POCT urinalysis dipstick, BASIC METABOLIC PANEL WITH GFR  Hyperlipidemia - Stable on statin and tolerating current dose well. No change of medication. Pt will continue to focus on good nutrition and physical activity.    Plan: Lipid panel  Cerumen impaction, bilateral- L canal cleared after irrigation; R canal required removal of cerumen w/ alligator forceps; still partial impaction remained. Pt has ear wax removal drops at home.  Meds ordered this encounter  Medications  . lisinopril (PRINIVIL,ZESTRIL) 2.5 MG tablet    Sig: Take 1 tablet by mouth daily to lower blood pressure.    Dispense:  90 tablet    Refill:  3

## 2014-03-08 ENCOUNTER — Encounter: Payer: Self-pay | Admitting: Family Medicine

## 2014-03-10 LAB — PAP IG (IMAGE GUIDED)

## 2014-03-11 ENCOUNTER — Telehealth: Payer: Self-pay

## 2014-03-11 ENCOUNTER — Ambulatory Visit (INDEPENDENT_AMBULATORY_CARE_PROVIDER_SITE_OTHER): Payer: Medicare Other | Admitting: *Deleted

## 2014-03-11 DIAGNOSIS — Z23 Encounter for immunization: Secondary | ICD-10-CM

## 2014-03-11 NOTE — Progress Notes (Signed)
   Subjective:    Patient ID: Rhonda Burns, female    DOB: 04/18/1946, 68 y.o.   MRN: [id number]  HPI Patient here for pneumonia 23 per Dr. Leward Quan chart note 03/07/2014.   Review of Systems     Objective:   Physical Exam        Assessment & Plan:

## 2014-03-11 NOTE — Patient Instructions (Signed)

## 2014-03-11 NOTE — Telephone Encounter (Signed)
Taken directly from patient AVS at visit for CPE-  Pneumonia shot- Once after the age of 45; if you are younger ask your healthcare provider if you need a pneumonia shot. Please return next week for Pneumovax (PPVS23); it will be 5 years since last vaccination on 03/10/2014.

## 2014-03-11 NOTE — Telephone Encounter (Signed)
Dr Leward Quan put in AVS, but failed to put order in computer. I will let her know to please put order in system in the future. Patient did get the vaccination today, at 102. Thanks. Amy

## 2014-03-11 NOTE — Telephone Encounter (Signed)
Pt came to 104 insisting she was told by dr Leward Quan to get a pneumonia shot, there are no orders in the system and clinical staff doubled checked Pt is requesting orders for pneumonia shot so she can come in and get

## 2014-06-24 ENCOUNTER — Encounter: Payer: Self-pay | Admitting: Family Medicine

## 2014-06-24 ENCOUNTER — Ambulatory Visit (INDEPENDENT_AMBULATORY_CARE_PROVIDER_SITE_OTHER): Payer: Medicare Other | Admitting: Family Medicine

## 2014-06-24 VITALS — BP 168/74 | HR 87 | Temp 98.5°F | Resp 16 | Ht 60.5 in | Wt 137.4 lb

## 2014-06-24 DIAGNOSIS — I10 Essential (primary) hypertension: Secondary | ICD-10-CM | POA: Diagnosis not present

## 2014-06-24 MED ORDER — FLUTICASONE PROPIONATE 50 MCG/ACT NA SUSP: 2.0000 | Freq: Every day | NASAL | Status: DC | PRN

## 2014-06-24 NOTE — Progress Notes (Signed)
S;  This 69 y.o. Female has HTN; at last visit BP was low normal and pt reported episodic dizziness. Medication dose (lisinopril 5 mg) was reduced to 1/2  (+ 2.5 mg). Pt admits a lot of stress in life in last 2-3 weeks. She has not been taking medication daily. She denies fatigue, vision disturbances, CP or tightness, palpitations, HA, dizziness, numbness, weakness or syncope.   Patient Active Problem List   Diagnosis Date Noted  . Chest pain 08/06/2013  . DOE (dyspnea on exertion) 08/06/2013  . Reactive airways dysfunction syndrome 09/27/2012  . Hypertension   . Hyperlipidemia   . Osteoporosis   . Allergic rhinitis, cause unspecified   . GERD (gastroesophageal reflux disease)     Prior to Admission medications   Medication Sig Start Date End Date Taking? Authorizing Provider  albuterol (VENTOLIN HFA) 108 (90 BASE) MCG/ACT inhaler Inhale 2 puffs into the lungs every 6 (six) hours as needed for wheezing. 11/19/13 11/19/14 Yes Barton Fanny, MD  aspirin 325 MG tablet Take 325 mg by mouth daily.   Yes Historical Provider, MD  calcium gluconate 500 MG tablet Take 500 mg by mouth daily.   Yes Historical Provider, MD  fish oil-omega-3 fatty acids 1000 MG capsule Take 1 g by mouth daily.    Yes Historical Provider, MD  fluticasone (FLONASE) 50 MCG/ACT nasal spray Place 2 sprays into both nostrils daily as needed for allergies. 06/24/14  Yes Barton Fanny, MD  lisinopril (PRINIVIL,ZESTRIL) 2.5 MG tablet Take 1 tablet by mouth daily to lower blood pressure. 03/07/14  Yes Barton Fanny, MD  pravastatin (PRAVACHOL) 40 MG tablet Take 1 tablet (40 mg total) by mouth daily. 11/19/13  Yes Barton Fanny, MD  traMADol (ULTRAM) 50 MG tablet Take 1 tablet (50 mg total) by mouth every 8 (eight) hours as needed. 09/13/13  Yes Barton Fanny, MD  diclofenac (VOLTAREN) 75 MG EC tablet Take 1 tablet (75 mg total) by mouth 2 (two) times daily. Patient not taking: Reported on 06/24/2014 08/26/13    Posey Boyer, MD    [facility] and FAM HX reviewed.  ROS: As per HPI.  O: Filed Vitals:   06/24/14 1557  BP: 168/74            BP recheck:  150/80  Pulse: 87  Temp: 98.5 F (36.9 C)  Resp: 16   GEN: in NAD: WN,WD. HENT: [place]/AT; otherwise normal. COR: RRR. LUNGS: Unlabored resp. SKIN; W&D; no diaphoresis. MS: MAEs; no c/c/e. NEURO: A&O x 3; CNs intact. Nonfocal.  A/P: Essential hypertension-  Continue lisinopril 2.5 mg 1 tab daily; manage stress. Recheck BP in 2 months.

## 2014-06-24 NOTE — Patient Instructions (Signed)
For joint pain : Tart cherry juice; Tumeric in capsule form. Also a supplement that is good for joint health (Glucosamine + chondroitin + MSM).

## 2014-07-11 ENCOUNTER — Ambulatory Visit: Payer: Medicare Other | Admitting: Family Medicine

## 2014-08-27 ENCOUNTER — Encounter: Payer: Self-pay | Admitting: Family Medicine

## 2014-08-27 ENCOUNTER — Ambulatory Visit (INDEPENDENT_AMBULATORY_CARE_PROVIDER_SITE_OTHER): Payer: Medicare Other | Admitting: Family Medicine

## 2014-08-27 VITALS — BP 150/90 | HR 66 | Temp 98.2°F | Resp 16 | Ht 60.5 in | Wt 137.6 lb

## 2014-08-27 DIAGNOSIS — M4692 Unspecified inflammatory spondylopathy, cervical region: Secondary | ICD-10-CM | POA: Diagnosis not present

## 2014-08-27 DIAGNOSIS — M542 Cervicalgia: Secondary | ICD-10-CM | POA: Diagnosis not present

## 2014-08-27 DIAGNOSIS — I10 Essential (primary) hypertension: Secondary | ICD-10-CM | POA: Diagnosis not present

## 2014-08-27 DIAGNOSIS — M47812 Spondylosis without myelopathy or radiculopathy, cervical region: Secondary | ICD-10-CM

## 2014-08-27 MED ORDER — WRIST BLOOD PRESSURE MONITOR MISC: 1.0000 | Freq: Every day | Status: DC

## 2014-08-27 MED ORDER — LISINOPRIL 5 MG PO TABS: 5.0000 mg | ORAL_TABLET | Freq: Every day | ORAL | Status: DC

## 2014-08-27 MED ORDER — TRAMADOL HCL 50 MG PO TABS: 50.0000 mg | ORAL_TABLET | Freq: Two times a day (BID) | ORAL | Status: DC | PRN

## 2014-08-28 ENCOUNTER — Encounter: Payer: Self-pay | Admitting: Family Medicine

## 2014-08-28 NOTE — Progress Notes (Signed)
S:  This 69 y.o. Female has essential benign HTN; she does not monitor BP at home.  At Nov 2015 visit, lisinopril dose was reduced to 2.5 mg 1 tablet daily. Pt has not report dizziness or lightheadedness,  CP or tightness, SOB, cough, HA, dizziness, weakness, numbness or syncope. She has had elevated BP readings elsewhere. Pt stays atcive and has stressful moments that increase BP. She sleeps well and practices good nutrition.  Neck pain and muscle spasms are intermittent but Tramadol has been very effective.   Patient Active Problem List   Diagnosis Date Noted  . Chest pain 08/06/2013  . DOE (dyspnea on exertion) 08/06/2013  . Reactive airways dysfunction syndrome 09/27/2012  . Hypertension   . Hyperlipidemia   . Osteoporosis   . Allergic rhinitis, cause unspecified   . GERD (gastroesophageal reflux disease)     Prior to Admission medications   Medication Sig Start Date End Date Taking? Authorizing Provider  albuterol (VENTOLIN HFA) 108 (90 BASE) MCG/ACT inhaler Inhale 2 puffs into the lungs every 6 (six) hours as needed for wheezing. 11/19/13 11/19/14 Yes Barton Fanny, MD  aspirin 325 MG tablet Take 325 mg by mouth daily.   Yes Historical Provider, MD  calcium gluconate 500 MG tablet Take 500 mg by mouth daily.   Yes Historical Provider, MD  fish oil-omega-3 fatty acids 1000 MG capsule Take 1 g by mouth daily.    Yes Historical Provider, MD  fluticasone (FLONASE) 50 MCG/ACT nasal spray Place 2 sprays into both nostrils daily as needed for allergies. 06/24/14  Yes Barton Fanny, MD  pravastatin (PRAVACHOL) 40 MG tablet Take 1 tablet (40 mg total) by mouth daily. 11/19/13  Yes Barton Fanny, MD  traMADol (ULTRAM) 50 MG tablet Take 1 tablet (50 mg total) by mouth every 12 (twelve) hours as needed.   Yes Barton Fanny, MD  lisinopril (PRINIVIL,ZESTRIL) 2.5 MG tablet Take 1 tablet (2.5 mg total) by mouth daily.    Barton Fanny, MD   SURG, SOC and FAM HX  reviewed.  ROS; As per HPI.  O: Filed Vitals:   08/27/14 1035 08/27/14 1154  BP: 160/80 150/90  Pulse: 66   Temp: 98.2 F (36.8 C)   TempSrc: Oral   Resp: 16   Height: 5' 0.5" (1.537 m)   Weight: 137 lb 9.6 oz (62.415 kg)   SpO2: 97%    GEN: In NAD: WN,WD. HENT: [place]/AT. EOMI w/clear conj/sclerae. Otherwise unremarkable. NECK: Supple w/ minimal spasms. COR: RRR. No edema. LUNGS: Normal resp rate and effort. NEURO: A&O x 3; CNs intact. Nonfocal.  A/P: Essential hypertension- Resume Lisinopril 5 mg  1 tablet daily. Monitor BP.  Cervical pain - Plan: traMADol (ULTRAM) 50 MG tablet  Cervical arthritis - Plan: traMADol (ULTRAM) 50 MG tablet  Meds ordered this encounter  Medications  . lisinopril (PRINIVIL,ZESTRIL) 5 MG tablet    Sig: Take 1 tablet (5 mg total) by mouth daily.    Dispense:  90 tablet    Refill:  3  . traMADol (ULTRAM) 50 MG tablet    Sig: Take 1 tablet (50 mg total) by mouth every 12 (twelve) hours as needed.    Dispense:  40 tablet    Refill:  2  . Blood Pressure Monitoring (WRIST BLOOD PRESSURE MONITOR) MISC    Sig: 1 Device by Does not apply route daily.    Dispense:  1 each    Refill:  0    Pt needs small  BP cuff device.   ICD 10: I10

## 2014-11-26 ENCOUNTER — Other Ambulatory Visit: Payer: Self-pay | Admitting: Family Medicine

## 2014-12-08 LAB — HM MAMMOGRAPHY

## 2015-01-08 ENCOUNTER — Encounter (HOSPITAL_COMMUNITY): Payer: Self-pay | Admitting: *Deleted

## 2015-01-28 ENCOUNTER — Ambulatory Visit: Payer: Medicare Other | Admitting: Family Medicine

## 2015-02-21 ENCOUNTER — Telehealth: Payer: Self-pay | Admitting: Family Medicine

## 2015-02-21 NOTE — Telephone Encounter (Signed)
Called pt to advise her that her appt on 11/16 has been changed to 9:00

## 2015-03-09 ENCOUNTER — Encounter: Payer: Medicare Other | Admitting: Family Medicine

## 2015-03-11 ENCOUNTER — Encounter: Payer: Self-pay | Admitting: Family Medicine

## 2015-03-11 ENCOUNTER — Other Ambulatory Visit: Payer: Self-pay

## 2015-03-11 ENCOUNTER — Ambulatory Visit (INDEPENDENT_AMBULATORY_CARE_PROVIDER_SITE_OTHER): Payer: Medicare Other | Admitting: Family Medicine

## 2015-03-11 ENCOUNTER — Encounter: Payer: Medicare Other | Admitting: Family Medicine

## 2015-03-11 VITALS — BP 134/74 | HR 71 | Temp 98.1°F | Resp 17 | Ht 60.75 in | Wt 141.0 lb

## 2015-03-11 DIAGNOSIS — E2839 Other primary ovarian failure: Secondary | ICD-10-CM

## 2015-03-11 DIAGNOSIS — I10 Essential (primary) hypertension: Secondary | ICD-10-CM

## 2015-03-11 DIAGNOSIS — Z780 Asymptomatic menopausal state: Secondary | ICD-10-CM

## 2015-03-11 DIAGNOSIS — Z23 Encounter for immunization: Secondary | ICD-10-CM

## 2015-03-11 DIAGNOSIS — Z0000 Encounter for general adult medical examination without abnormal findings: Secondary | ICD-10-CM | POA: Diagnosis not present

## 2015-03-11 DIAGNOSIS — E785 Hyperlipidemia, unspecified: Secondary | ICD-10-CM

## 2015-03-11 DIAGNOSIS — Z130 Encounter for screening for diseases of the blood and blood-forming organs and certain disorders involving the immune mechanism: Secondary | ICD-10-CM

## 2015-03-11 DIAGNOSIS — Z8739 Personal history of other diseases of the musculoskeletal system and connective tissue: Secondary | ICD-10-CM

## 2015-03-11 LAB — COMPREHENSIVE METABOLIC PANEL
ALT: 18 U/L (ref 6–29)
AST: 23 U/L (ref 10–35)
Albumin: 4.6 g/dL (ref 3.6–5.1)
Alkaline Phosphatase: 77 U/L (ref 33–130)
BUN: 13 mg/dL (ref 7–25)
CHLORIDE: 104 mmol/L (ref 98–110)
CO2: 27 mmol/L (ref 20–31)
CREATININE: 0.79 mg/dL (ref 0.50–0.99)
Calcium: 9.8 mg/dL (ref 8.6–10.4)
Glucose, Bld: 85 mg/dL (ref 65–99)
Potassium: 3.6 mmol/L (ref 3.5–5.3)
SODIUM: 142 mmol/L (ref 135–146)
Total Bilirubin: 0.9 mg/dL (ref 0.2–1.2)
Total Protein: 7.5 g/dL (ref 6.1–8.1)

## 2015-03-11 LAB — LIPID PANEL
Cholesterol: 121 mg/dL — ABNORMAL LOW (ref 125–200)
HDL: 59 mg/dL (ref >=46)
LDL Cholesterol: 42 mg/dL (ref <130)
Total CHOL/HDL Ratio: 2.1 Ratio (ref <=5.0)
Triglycerides: 101 mg/dL (ref <150)
VLDL: 20 mg/dL (ref <30)

## 2015-03-11 LAB — CBC
HCT: 38.4 % (ref 36.0–46.0)
Hemoglobin: 12.9 g/dL (ref 12.0–15.0)
MCH: 29.4 pg (ref 26.0–34.0)
MCHC: 33.6 g/dL (ref 30.0–36.0)
MCV: 87.5 fL (ref 78.0–100.0)
MPV: 10.2 fL (ref 8.6–12.4)
PLATELETS: 342 10*3/uL (ref 150–400)
RBC: 4.39 MIL/uL (ref 3.87–5.11)
RDW: 12.5 % (ref 11.5–15.5)
WBC: 6.1 10*3/uL (ref 4.0–10.5)

## 2015-03-11 LAB — TSH: TSH: 1.597 u[IU]/mL (ref 0.350–4.500)

## 2015-03-11 MED ORDER — PRAVASTATIN SODIUM 40 MG PO TABS: ORAL_TABLET | ORAL | Status: DC

## 2015-03-11 MED ORDER — LISINOPRIL 5 MG PO TABS: 5.0000 mg | ORAL_TABLET | Freq: Every day | ORAL | Status: DC

## 2015-03-11 NOTE — Progress Notes (Signed)
[facility] [address] [place] [address] [phone]  Date:  03/11/2015   Name:  Rhonda Burns   DOB:  [birth year]   MRN:  [id number]  PCP:  Ellsworth Lennox, MD    Chief Complaint: No chief complaint on file.   History of Present Illness:  Rhonda Burns is a 69 y.o. very pleasant female patient who presents with the following:  She was last seen here by Dr. Leward Quan- now retired- about 6 months ago. At that visit her lisinopril was increased to 5mg  for uncontrolled HTN.    Here today for a CPE. She received pneumovax last year- needs prevnar today  She does have a BP monitor at home but does not really use it.   She is fasting today for labs   Last dexa about 3.5 years ago- normal.  She was on fosamax in the past- she last took it maybe 5 years ago  Last pap in November of 2015- it was normal  She had a CV work-up in 2015- ETT looked good. Her sx of palpitations have improved since then She does note pains in her legs and knees- she wonders if it might be OA.  She tends to get a lot of exercise and is very active.   She has noted these for 1-2 months- more in the right.  She has more pain when she lays down at night.  However she does not notice any desire to move her legs like we might see with RLS She may take tylenol for this- it does help.  Occasaionally she will take a tramadol  Former smoker.  She enjoys walking her dog for exercise  Patient Active Problem List   Diagnosis Date Noted  . Chest pain 08/06/2013  . DOE (dyspnea on exertion) 08/06/2013  . Reactive airways dysfunction syndrome 09/27/2012  . Hypertension   . Hyperlipidemia   . Osteoporosis   . Allergic rhinitis, cause unspecified   . GERD (gastroesophageal reflux disease)     Past Medical History  Diagnosis Date  . Hypertension   . Hyperlipidemia   . Osteoporosis   . Allergy   . Osteoporosis   . Allergic rhinitis, cause unspecified   . GERD (gastroesophageal reflux  disease)   . Asthma     Past Surgical History  Procedure Laterality Date  . Cesarean section      Social History  Substance Use Topics  . Smoking status: Former Research scientist (life sciences)  . Smokeless tobacco: None  . Alcohol Use: No    Family History  Problem Relation Age of Onset  . Alzheimer's disease Mother   . Heart disease Mother   . Stroke Mother   . Asthma Sister   . Heart disease Father   . Heart attack Father   . Diabetes Father     Allergies  Allergen Reactions  . Other Swelling    Pecans and coconut    Medication list has been reviewed and updated.  Current Outpatient Prescriptions on File Prior to Visit  Medication Sig Dispense Refill  . albuterol (VENTOLIN HFA) 108 (90 BASE) MCG/ACT inhaler Inhale 2 puffs into the lungs every 6 (six) hours as needed for wheezing. 1 Inhaler 3  . aspirin 325 MG tablet Take 325 mg by mouth daily.    . Blood Pressure Monitoring (WRIST BLOOD PRESSURE MONITOR) MISC 1 Device by Does not apply route daily. 1 each 0  . calcium gluconate 500 MG tablet Take 500 mg by mouth daily.    Marland Kitchen  fish oil-omega-3 fatty acids 1000 MG capsule Take 1 g by mouth daily.     . fluticasone (FLONASE) 50 MCG/ACT nasal spray Place 2 sprays into both nostrils daily as needed for allergies. 16 g 5  . lisinopril (PRINIVIL,ZESTRIL) 5 MG tablet Take 1 tablet (5 mg total) by mouth daily. 90 tablet 3  . pravastatin (PRAVACHOL) 40 MG tablet TAKE 1 TABLET DAILY. 90 tablet 0  . traMADol (ULTRAM) 50 MG tablet Take 1 tablet (50 mg total) by mouth every 12 (twelve) hours as needed. 40 tablet 2   No current facility-administered medications on file prior to visit.    Review of Systems:  As per HPI- otherwise negative.   Physical Examination: Filed Vitals:   03/11/15 0910  BP: 176/67  Pulse: 71  Temp: 98.1 F (36.7 C)  Resp: 17   Filed Vitals:   03/11/15 0910  Height: 5' 0.75" (1.543 m)  Weight: 141 lb (63.957 kg)   Body mass index is 26.86 kg/(m^2). Ideal Body  Weight: Weight in (lb) to have BMI = 25: 131  GEN: WDWN, NAD, Non-toxic, A & O x 3, looks well, mild overweight HEENT: Atraumatic, Normocephalic. Neck supple. No masses, No LAD.  Bilateral cerumen impaction, oropharynx normal.  PEERL,EOMI.   Ears and Nose: No external deformity. CV: RRR, No M/G/R. No JVD. No thrill. No extra heart sounds. PULM: CTA B, no wheezes, crackles, rhonchi. No retractions. No resp. distress. No accessory muscle use. ABD: S, NT, ND. No rebound. No HSM. EXTR: No c/c/e NEURO Normal gait.  PSYCH: Normally interactive. Conversant. Not depressed or anxious appearing.  Calm demeanor.  Breast: normal exam, no masses/ dimpling/ discharge  No swelling or tenderness of her bilateral calves.  Knees with normal ROM, no crepitus  Ears irrigated with water and cerumen cleared Declines flu shot   Assessment and Plan: Physical exam  Screening for deficiency anemia - Plan: CBC  Essential hypertension - Plan: Comprehensive metabolic panel, TSH, lisinopril (PRINIVIL,ZESTRIL) 5 MG tablet  Hyperlipidemia - Plan: Lipid panel, pravastatin (PRAVACHOL) 40 MG tablet  Postmenopausal - Plan: DG Bone Density  History of osteoporosis - Plan: DG Bone Density  Immunization due - Plan: Pneumococcal conjugate vaccine 13-valent IM  CPE today Labs pending as above Given prevnar 13 vac Order bone density Refilled her pravachol and lisinopril - rechecked BP looks ok  Signed Lamar Blinks, MD

## 2015-03-11 NOTE — Patient Instructions (Addendum)
It was great to see you today- I will be in touch with your labs asap We will get you set up for a repeat bone density scan- let me know if you do not hear about this soon Please try to keep an eye on your blood pressure at home.  Check it maybe once a week for the next month or so and send me a list of some of your readings   Please continue to take your blood pressure and cholesterol medications

## 2015-03-24 ENCOUNTER — Ambulatory Visit
Admission: RE | Admit: 2015-03-24 | Discharge: 2015-03-24 | Disposition: A | Discharge status: Home / Self Care | Payer: Medicare Other | Source: Ambulatory Visit | Attending: Family Medicine | Admitting: Family Medicine

## 2015-03-24 DIAGNOSIS — E2839 Other primary ovarian failure: Secondary | ICD-10-CM

## 2015-05-27 ENCOUNTER — Ambulatory Visit (INDEPENDENT_AMBULATORY_CARE_PROVIDER_SITE_OTHER): Payer: PPO | Admitting: Family Medicine

## 2015-05-27 VITALS — BP 140/74 | HR 74 | Temp 98.7°F | Resp 16 | Ht 60.5 in | Wt 140.4 lb

## 2015-05-27 DIAGNOSIS — E785 Hyperlipidemia, unspecified: Secondary | ICD-10-CM | POA: Diagnosis not present

## 2015-05-27 DIAGNOSIS — R1011 Right upper quadrant pain: Secondary | ICD-10-CM

## 2015-05-27 DIAGNOSIS — K219 Gastro-esophageal reflux disease without esophagitis: Secondary | ICD-10-CM

## 2015-05-27 DIAGNOSIS — R1010 Upper abdominal pain, unspecified: Secondary | ICD-10-CM | POA: Diagnosis not present

## 2015-05-27 DIAGNOSIS — G8929 Other chronic pain: Secondary | ICD-10-CM | POA: Diagnosis not present

## 2015-05-27 MED ORDER — PRAVASTATIN SODIUM 40 MG PO TABS
ORAL_TABLET | ORAL | Status: DC
Start: 2015-05-27 — End: 2016-03-10

## 2015-05-27 NOTE — Progress Notes (Signed)
[facility] [address] [place] [address] [phone]  Date:  05/27/2015   Name:  Rhonda Burns   DOB:  1945/11/26   MRN:  [id number]  PCP:  Ellsworth Lennox, MD    Chief Complaint: acid reflux; right side pain; and Medication Refill   History of Present Illness:  Rhonda Burns is a 70 y.o. very pleasant female patient who presents with the following:  Last seen by myself in November for a CPE. We did labs for her at that time also  She has noted acid reflux and feels that it is getting worse. She notes sx of gas and burping. She will feel a burning sensation in her throat.  No chest pain or burning  She does tend to eat a lot of onions, tomatoes and mints - wonders if this could be making her worse She has tried some carafate and does think this helps her some.  She does use the occasional pepcid She has noted reflux off an on for 15 years or so  also she has noted some right upper belly pain. She had had this for maybe 10 years, and was told that this might be due to a liver cyst.  She had a CT at some point when she was living in [place] and was told about the cyst.  However there did not seem to be anything that needed to be done.  She had a negative abd [place] about 7 years ago  She does not feel that the pain is related to the reflux.  She may have this pain 20 days of the month, it may come and go, describes as a "grinching pain" that lasts for 5 minutes or so  She is a non -smoker, does not drink coffee, drinks few sodas. No alcohol  No vomiting.  Normal BM, regular.   She had a colonoscopy in 2010  Wt Readings from Last 3 Encounters:  05/27/15 140 lb 6.4 oz (63.685 kg)  03/11/15 141 lb (63.957 kg)  08/27/14 137 lb 9.6 oz (62.415 kg)     Patient Active Problem List   Diagnosis Date Noted  . Chest pain 08/06/2013  . DOE (dyspnea on exertion) 08/06/2013  . Reactive airways dysfunction syndrome 09/27/2012  . Hypertension   . Hyperlipidemia   .  Osteoporosis   . Allergic rhinitis, cause unspecified   . GERD (gastroesophageal reflux disease)     Past Medical History  Diagnosis Date  . Hypertension   . Hyperlipidemia   . Osteoporosis   . Allergy   . Osteoporosis   . Allergic rhinitis, cause unspecified   . GERD (gastroesophageal reflux disease)   . Asthma     Past Surgical History  Procedure Laterality Date  . Cesarean section      Social History  Substance Use Topics  . Smoking status: Former Research scientist (life sciences)  . Smokeless tobacco: Not on file  . Alcohol Use: No    Family History  Problem Relation Age of Onset  . Alzheimer's disease Mother   . Heart disease Mother   . Stroke Mother   . Asthma Sister   . Heart disease Father   . Heart attack Father   . Diabetes Father     Allergies  Allergen Reactions  . Other Swelling    Pecans and coconut    Medication list has been reviewed and updated.  Current Outpatient Prescriptions on File Prior to Visit  Medication Sig Dispense Refill  . aspirin 325  MG tablet Take 325 mg by mouth daily.    . Blood Pressure Monitoring (WRIST BLOOD PRESSURE MONITOR) MISC 1 Device by Does not apply route daily. 1 each 0  . calcium gluconate 500 MG tablet Take 500 mg by mouth daily.    . Cholecalciferol (D3 SUPER STRENGTH) 2000 UNITS CAPS Take by mouth once.    . fish oil-omega-3 fatty acids 1000 MG capsule Take 1 g by mouth daily.     . fluticasone (FLONASE) 50 MCG/ACT nasal spray Place 2 sprays into both nostrils daily as needed for allergies. 16 g 5  . lisinopril (PRINIVIL,ZESTRIL) 5 MG tablet Take 1 tablet (5 mg total) by mouth daily. 90 tablet 3  . pravastatin (PRAVACHOL) 40 MG tablet TAKE 1 TABLET DAILY. 90 tablet 0  . traMADol (ULTRAM) 50 MG tablet Take 1 tablet (50 mg total) by mouth every 12 (twelve) hours as needed. 40 tablet 2  . albuterol (VENTOLIN HFA) 108 (90 BASE) MCG/ACT inhaler Inhale 2 puffs into the lungs every 6 (six) hours as needed for wheezing. 1 Inhaler 3   No  current facility-administered medications on file prior to visit.    Review of Systems:  As per HPI- otherwise negative.   Physical Examination: Filed Vitals:   05/27/15 0929 05/27/15 0935  BP: 140/80 140/74  Pulse: 74   Temp: 98.7 F (37.1 C)   Resp: 16    Filed Vitals:   05/27/15 0929  Height: 5' 0.5" (1.537 m)  Weight: 140 lb 6.4 oz (63.685 kg)   Body mass index is 26.96 kg/(m^2). Ideal Body Weight: Weight in (lb) to have BMI = 25: 129.9  GEN: WDWN, NAD, Non-toxic, A & O x 3, looks well, weight is stable HEENT: Atraumatic, Normocephalic. Neck supple. No masses, No LAD.  Bilateral TM wnl, oropharynx normal.  PEERL,EOMI.   Ears and Nose: No external deformity. CV: RRR, No M/G/R. No JVD. No thrill. No extra heart sounds. PULM: CTA B, no wheezes, crackles, rhonchi. No retractions. No resp. distress. No accessory muscle use. ABD: S, NT, ND, +BS. No rebound. No HSM.  Belly is benign, negative muphys EXTR: No c/c/e NEURO Normal gait.  PSYCH: Normally interactive. Conversant. Not depressed or anxious appearing.  Calm demeanor.    Assessment and Plan: Gastroesophageal reflux disease without esophagitis - Plan: H. pylori breath test  Chronic RUQ pain - Plan: US Abdomen Limited RUQ  Hyperlipidemia - Plan: pravastatin (PRAVACHOL) 40 MG tablet  Will check h pylori and follow-up with her Refilled her pravachol She is somewhat worried that her GERD may cause throat cancer- reassured that this cancer is most often associated smoking/ etoh Will check a RUQ [place] for her to eval long term pain in this region.  Suspect the pain is of benign cause  Signed Lamar Blinks, MD

## 2015-05-27 NOTE — Patient Instructions (Addendum)
It was good to see you today- I will be in touch with your breath test for H pylori and we will arrange your liver and gallbladder ultrasound.  Take care and let me know if you have any changes or worsening of your symptoms .  It would be my pleasure to continue to see you as a patient at my new office, starting the last week of February.  If you prefer to remain at [facility] one of my partners will be happy to see you here.   [facility] [address] [place], [place] [address] Phone: [phone]

## 2015-05-28 LAB — H. PYLORI BREATH TEST: H. PYLORI BREATH TEST: NOT DETECTED

## 2015-06-05 ENCOUNTER — Ambulatory Visit
Admission: RE | Admit: 2015-06-05 | Discharge: 2015-06-05 | Disposition: A | Discharge status: Home / Self Care | Payer: PPO | Source: Ambulatory Visit | Attending: Family Medicine | Admitting: Family Medicine

## 2015-06-05 DIAGNOSIS — R1011 Right upper quadrant pain: Secondary | ICD-10-CM | POA: Diagnosis not present

## 2015-06-05 DIAGNOSIS — G8929 Other chronic pain: Secondary | ICD-10-CM

## 2015-06-07 ENCOUNTER — Other Ambulatory Visit: Payer: Self-pay | Admitting: Family Medicine

## 2015-06-07 DIAGNOSIS — R1011 Right upper quadrant pain: Secondary | ICD-10-CM

## 2015-06-09 ENCOUNTER — Encounter: Payer: Self-pay | Admitting: Gastroenterology

## 2015-06-25 ENCOUNTER — Encounter: Payer: Self-pay | Admitting: Gastroenterology

## 2015-07-02 ENCOUNTER — Ambulatory Visit: Payer: PPO | Admitting: Gastroenterology

## 2015-07-13 ENCOUNTER — Telehealth: Payer: Self-pay

## 2015-07-13 MED ORDER — FLUTICASONE PROPIONATE 50 MCG/ACT NA SUSP: 2.0000 | Freq: Every day | NASAL | Status: DC | PRN

## 2015-07-13 NOTE — Telephone Encounter (Signed)
The patient is requesting a new prescription for fluticasone (FLONASE) 50 MCG/ACT nasal spray.  Her old rx has expired.  She used to see Dr Rhonda Burns, but has since changed her PCP to Dr Rhonda Burns.  She said she has not seen Dr Rhonda Burns yet, since she last saw Dr Rhonda Burns in February for her med refills.  CB#: [phone]

## 2015-07-13 NOTE — Telephone Encounter (Signed)
Rx sent 

## 2015-07-16 ENCOUNTER — Encounter: Payer: Self-pay | Admitting: Gastroenterology

## 2015-07-16 ENCOUNTER — Ambulatory Visit (INDEPENDENT_AMBULATORY_CARE_PROVIDER_SITE_OTHER): Payer: PPO | Admitting: Gastroenterology

## 2015-07-16 ENCOUNTER — Other Ambulatory Visit (INDEPENDENT_AMBULATORY_CARE_PROVIDER_SITE_OTHER): Payer: PPO

## 2015-07-16 VITALS — BP 128/78 | HR 76 | Ht 60.0 in | Wt 140.0 lb

## 2015-07-16 DIAGNOSIS — R932 Abnormal findings on diagnostic imaging of liver and biliary tract: Secondary | ICD-10-CM

## 2015-07-16 DIAGNOSIS — R109 Unspecified abdominal pain: Secondary | ICD-10-CM | POA: Diagnosis not present

## 2015-07-16 DIAGNOSIS — R1011 Right upper quadrant pain: Secondary | ICD-10-CM | POA: Diagnosis not present

## 2015-07-16 DIAGNOSIS — R1013 Epigastric pain: Secondary | ICD-10-CM

## 2015-07-16 LAB — HEPATIC FUNCTION PANEL
ALT: 18 U/L (ref 0–35)
AST: 19 U/L (ref 0–37)
Albumin: 4.3 g/dL (ref 3.5–5.2)
Alkaline Phosphatase: 74 U/L (ref 39–117)
BILIRUBIN TOTAL: 0.7 mg/dL (ref 0.2–1.2)
Bilirubin, Direct: 0.1 mg/dL (ref 0.0–0.3)
Total Protein: 7.9 g/dL (ref 6.0–8.3)

## 2015-07-16 NOTE — Patient Instructions (Addendum)
Your physician has requested that you go to the basement for the following lab work before leaving today:LFT's.  You have been scheduled for an MRI/MRCP at [facility] on 07/24/15. Your appointment time is 9:00am. Please arrive 15 minutes prior to your appointment time for registration purposes. Please make certain not to have anything to eat or drink 4 hours prior to your test. In addition, if you have any metal in your body, have a pacemaker or defibrillator, please be sure to let your ordering physician know. This test typically takes 45 minutes to 1 hour to complete.

## 2015-07-16 NOTE — Progress Notes (Signed)
HPI :  70 y/o female with h/o HTN, HLD, asthma, GERD, seen in consultation for RUQ pain and abnormal imaging of the biliary tree on imaging with [place].   Patient has had some chronic RUQ pain for at least 10 years. She states initially she had a scan of her abdomen about 10 years ago or so done while living in [place], and told she had a "cyst" in her liver. She said over time this was followed with imaging and told it had resolved. Pain is in the RUQ mostly, but can occasoinally bother her in the left side as well. It feels like a pinching sensation. It is present mostly all the time, the severity can fluctuate over time. When not bothering her too much rated 5/10, and then 9/10 when severe. It is severe roughly daily - last minutes at a time. She does not think there is any relation to eating. She denies any postprandial worsening. She is eating well. No nausea or vomiting. She is not losing any weight, she has weight gain. She cannot think of anything to make the pain worse or elicit, and nothing can make it better.  She has a history of GERD  She takes prevacid once daily PRN, mostly for gas and belching, which doesn't really bother her too much.  She has never had a prior upper endoscopy. She otherwise also has some discomfort in her epigastric area, tingling in the mid upper abdomen that is different from her RUQ pain. She thinks it has been present for a year or so. No relatoinship to eating and comes and goes. No clear precipitants. She thinks it is separate from her other discomfort. She will feel this twice per week or so, lasts for a minute or two and then resolves. She has had a negative breath test for H pylori thus far. She has not had a prior upper endoscopy  She had an US of the abdomen on 06/21/15. It showed CBD of 1.1cm with normal GB and no stones in the GB. She had an [place] in 2010 with a normal CBD. She has had a prior colonoscopy in 2010, which she thinks showed no polyps. No FH of  CRC. 2 aunts have had breast cancer, and cousin.     Past Medical History  Diagnosis Date  . Hypertension   . Hyperlipidemia   . Osteoporosis   . Allergy   . Osteoporosis   . Allergic rhinitis, cause unspecified   . GERD (gastroesophageal reflux disease)   . Asthma      Past Surgical History  Procedure Laterality Date  . Cesarean section     Family History  Problem Relation Age of Onset  . Alzheimer's disease Mother   . Heart disease Mother   . Stroke Mother   . Asthma Sister   . Heart disease Father   . Heart attack Father   . Diabetes Father    Social History  Substance Use Topics  . Smoking status: Former Research scientist (life sciences)  . Smokeless tobacco: None  . Alcohol Use: No   Current Outpatient Prescriptions  Medication Sig Dispense Refill  . aspirin 325 MG tablet Take 325 mg by mouth daily.    . Blood Pressure Monitoring (WRIST BLOOD PRESSURE MONITOR) MISC 1 Device by Does not apply route daily. 1 each 0  . calcium gluconate 500 MG tablet Take 500 mg by mouth daily.    . Cholecalciferol (D3 SUPER STRENGTH) 2000 UNITS CAPS Take by mouth once.    Marland Kitchen  fish oil-omega-3 fatty acids 1000 MG capsule Take 1 g by mouth daily.     . fluticasone (FLONASE) 50 MCG/ACT nasal spray Place 2 sprays into both nostrils daily as needed for allergies. 16 g 5  . lisinopril (PRINIVIL,ZESTRIL) 5 MG tablet Take 1 tablet (5 mg total) by mouth daily. 90 tablet 3  . pravastatin (PRAVACHOL) 40 MG tablet TAKE 1 TABLET DAILY. 90 tablet 3  . traMADol (ULTRAM) 50 MG tablet Take 1 tablet (50 mg total) by mouth every 12 (twelve) hours as needed. 40 tablet 2  . albuterol (VENTOLIN HFA) 108 (90 BASE) MCG/ACT inhaler Inhale 2 puffs into the lungs every 6 (six) hours as needed for wheezing. 1 Inhaler 3   No current facility-administered medications for this visit.   Allergies  Allergen Reactions  . Other Swelling    Pecans and coconut     Review of Systems: All systems reviewed and negative except where noted  in HPI.   Lab Results  Component Value Date   ALT 18 07/16/2015   AST 19 07/16/2015   ALKPHOS 74 07/16/2015   BILITOT 0.7 07/16/2015    Lab Results  Component Value Date   CREATININE 0.79 03/11/2015   BUN 13 03/11/2015   NA 142 03/11/2015   K 3.6 03/11/2015   CL 104 03/11/2015   CO2 27 03/11/2015    Lab Results  Component Value Date   WBC 6.1 03/11/2015   HGB 12.9 03/11/2015   HCT 38.4 03/11/2015   MCV 87.5 03/11/2015   PLT 342 03/11/2015     Physical Exam: BP 128/78 mmHg  Pulse 76  Ht 5' (1.524 m)  Wt 140 lb (63.504 kg)  BMI 27.34 kg/m2 Constitutional: Pleasant,well-developed, female in no acute distress. HEENT: Normocephalic and atraumatic. Conjunctivae are normal. No scleral icterus. Neck supple.  Cardiovascular: Normal rate, regular rhythm.  Pulmonary/chest: Effort normal and breath sounds normal. No wheezing, rales or rhonchi. Abdominal: Soft, nondistended, nontender. Negative Carnett sign. Bowel sounds active throughout. There are no masses palpable.  Extremities: no edema Lymphadenopathy: No cervical adenopathy noted. Neurological: Alert and oriented to person place and time. Skin: Skin is warm and dry. No rashes noted. Psychiatric: Normal mood and affect. Behavior is normal.   ASSESSMENT AND PLAN: 70 y/o female presenting with chronic RUQ pain as outlined above - present for 10 years without clear precipitants and is present constantly. She has had imaging for this issue over the years (no old imaging available) which she states hasn't showed much of anything significant. She had an older [place] which showed a normal CBD, but her CBD is now 48mm without evidence of stones on [place]. Her pain I suspect may be musculoskeletal based on how she describes it, and that prior imaging when in pain hasn't shown any etiology, although I could not reproduce her pain on exam. She does however have a new epigastric discomfort which is also intermittent. Her LFTs were repeated  today and are normal. For now, given change in the bile duct noted on interval [place], will obtain an MRCP to clear the biliary tree, rule out neoplasm. Regarding her pain, we will await this result, and if negative, can consider EGD but I think this will be less likely to explain her symptoms as well. She agreed with the plan, we will let her know the results of MRCP when available.    Royston Cellar, MD [facility] Pager [phone]  CC:  Copland, Gay Filler, MD

## 2015-07-22 ENCOUNTER — Ambulatory Visit (HOSPITAL_COMMUNITY): Payer: PPO

## 2015-07-24 ENCOUNTER — Ambulatory Visit (HOSPITAL_COMMUNITY)
Admission: RE | Admit: 2015-07-24 | Discharge: 2015-07-24 | Disposition: A | Discharge status: Home / Self Care | Payer: PPO | Source: Ambulatory Visit | Attending: Gastroenterology | Admitting: Gastroenterology

## 2015-07-24 ENCOUNTER — Other Ambulatory Visit: Payer: Self-pay | Admitting: Gastroenterology

## 2015-07-24 DIAGNOSIS — R932 Abnormal findings on diagnostic imaging of liver and biliary tract: Secondary | ICD-10-CM

## 2015-07-24 DIAGNOSIS — R935 Abnormal findings on diagnostic imaging of other abdominal regions, including retroperitoneum: Secondary | ICD-10-CM | POA: Diagnosis not present

## 2015-07-24 DIAGNOSIS — R1011 Right upper quadrant pain: Secondary | ICD-10-CM

## 2015-07-24 DIAGNOSIS — K838 Other specified diseases of biliary tract: Secondary | ICD-10-CM | POA: Insufficient documentation

## 2015-07-24 LAB — POCT I-STAT CREATININE: Creatinine, Ser: 0.8 mg/dL (ref 0.44–1.00)

## 2015-07-24 LAB — POCT I-STAT TROPONIN I: TROPONIN I, POC: 0 ng/mL (ref 0.00–0.08)

## 2015-07-24 MED ORDER — GADOBENATE DIMEGLUMINE 529 MG/ML IV SOLN
15.0000 mL | Freq: Once | INTRAVENOUS | Status: AC | PRN
  Administered 2015-07-24: 13 mL via INTRAVENOUS

## 2015-08-06 ENCOUNTER — Ambulatory Visit (AMBULATORY_SURGERY_CENTER): Payer: Self-pay | Admitting: *Deleted

## 2015-08-06 VITALS — Ht 60.0 in | Wt 137.8 lb

## 2015-08-06 DIAGNOSIS — R932 Abnormal findings on diagnostic imaging of liver and biliary tract: Secondary | ICD-10-CM

## 2015-08-06 DIAGNOSIS — R1013 Epigastric pain: Secondary | ICD-10-CM

## 2015-08-06 NOTE — Progress Notes (Signed)
No egg or soy allergy known to patient  No issues with past sedation with any surgeries  or procedures, no intubation problems  No diet pills per patient No home 02 use per patient  No blood thinners per patient  Pt denies issues with constipation   

## 2015-08-21 ENCOUNTER — Encounter: Payer: Self-pay | Admitting: Gastroenterology

## 2015-08-21 ENCOUNTER — Ambulatory Visit (AMBULATORY_SURGERY_CENTER): Payer: PPO | Admitting: Gastroenterology

## 2015-08-21 VITALS — BP 124/61 | HR 69 | Temp 97.8°F | Resp 15 | Ht 60.0 in | Wt 137.0 lb

## 2015-08-21 DIAGNOSIS — K222 Esophageal obstruction: Secondary | ICD-10-CM | POA: Diagnosis not present

## 2015-08-21 DIAGNOSIS — R932 Abnormal findings on diagnostic imaging of liver and biliary tract: Secondary | ICD-10-CM

## 2015-08-21 DIAGNOSIS — J45909 Unspecified asthma, uncomplicated: Secondary | ICD-10-CM | POA: Diagnosis not present

## 2015-08-21 DIAGNOSIS — I10 Essential (primary) hypertension: Secondary | ICD-10-CM | POA: Diagnosis not present

## 2015-08-21 DIAGNOSIS — K2950 Unspecified chronic gastritis without bleeding: Secondary | ICD-10-CM | POA: Diagnosis not present

## 2015-08-21 DIAGNOSIS — R198 Other specified symptoms and signs involving the digestive system and abdomen: Secondary | ICD-10-CM

## 2015-08-21 DIAGNOSIS — R109 Unspecified abdominal pain: Secondary | ICD-10-CM | POA: Diagnosis not present

## 2015-08-21 MED ORDER — SODIUM CHLORIDE 0.9 % IV SOLN: 500.0000 mL | INTRAVENOUS | Status: DC

## 2015-08-21 NOTE — Patient Instructions (Signed)
Discharge instructions given. Biopsies taken. Resume previous medications. YOU HAD AN ENDOSCOPIC PROCEDURE TODAY AT THE [facility]:   Refer to the procedure report that was given to you for any specific questions about what was found during the examination.  If the procedure report does not answer your questions, please call your gastroenterologist to clarify.  If you requested that your care partner not be given the details of your procedure findings, then the procedure report has been included in a sealed envelope for you to review at your convenience later.  YOU SHOULD EXPECT: Some feelings of bloating in the abdomen. Passage of more gas than usual.  Walking can help get rid of the air that was put into your GI tract during the procedure and reduce the bloating. If you had a lower endoscopy (such as a colonoscopy or flexible sigmoidoscopy) you may notice spotting of blood in your stool or on the toilet paper. If you underwent a bowel prep for your procedure, you may not have a normal bowel movement for a few days.  Please Note:  You might notice some irritation and congestion in your nose or some drainage.  This is from the oxygen used during your procedure.  There is no need for concern and it should clear up in a day or so.  SYMPTOMS TO REPORT IMMEDIATELY:   Following upper endoscopy (EGD)  Vomiting of blood or coffee ground material  New chest pain or pain under the shoulder blades  Painful or persistently difficult swallowing  New shortness of breath  Fever of 100F or higher  Black, tarry-looking stools  For urgent or emergent issues, a gastroenterologist can be reached at any hour by calling [phone].   DIET: Your first meal following the procedure should be a small meal and then it is ok to progress to your normal diet. Heavy or fried foods are harder to digest and may make you feel nauseous or bloated.  Likewise, meals heavy in dairy and vegetables can increase  bloating.  Drink plenty of fluids but you should avoid alcoholic beverages for 24 hours.  ACTIVITY:  You should plan to take it easy for the rest of today and you should NOT DRIVE or use heavy machinery until tomorrow (because of the sedation medicines used during the test).    FOLLOW UP: Our staff will call the number listed on your records the next business day following your procedure to check on you and address any questions or concerns that you may have regarding the information given to you following your procedure. If we do not reach you, we will leave a message.  However, if you are feeling well and you are not experiencing any problems, there is no need to return our call.  We will assume that you have returned to your regular daily activities without incident.  If any biopsies were taken you will be contacted by phone or by letter within the next 1-3 weeks.  Please call us at [phone] if you have not heard about the biopsies in 3 weeks.    SIGNATURES/CONFIDENTIALITY: You and/or your care partner have signed paperwork which will be entered into your electronic medical record.  These signatures attest to the fact that that the information above on your After Visit Summary has been reviewed and is understood.  Full responsibility of the confidentiality of this discharge information lies with you and/or your care-partner. 

## 2015-08-21 NOTE — Progress Notes (Signed)
To recovery, report to McCoy, RN, VSS 

## 2015-08-21 NOTE — Op Note (Signed)
[facility] Patient Name: Rhonda Burns Procedure Date: 08/21/2015 10:19 AM MRN: [id number] Endoscopist: Remo Lipps P. Havery Moros , MD Age: 70 Date of Birth: 01/27/1946 Gender: Female Procedure:                Upper GI endoscopy Indications:              Upper abdominal pain Medicines:                Monitored Anesthesia Care Procedure:                Pre-Anesthesia Assessment:                           - Prior to the procedure, a History and Physical                            was performed, and patient medications and                            allergies were reviewed. The patient's tolerance of                            previous anesthesia was also reviewed. The risks                            and benefits of the procedure and the sedation                            options and risks were discussed with the patient.                            All questions were answered, and informed consent                            was obtained. Prior Anticoagulants: The patient has                            taken aspirin, last dose was 1 day prior to                            procedure. ASA Grade Assessment: II - A patient                            with mild systemic disease. After reviewing the                            risks and benefits, the patient was deemed in                            satisfactory condition to undergo the procedure.                           After obtaining informed consent, the endoscope was  passed under direct vision. Throughout the                            procedure, the patient's blood pressure, pulse, and                            oxygen saturations were monitored continuously. The                            Model GIF-[id number]) scope was introduced                            through the mouth, and advanced to the second part                            of duodenum. The upper GI endoscopy was   accomplished without difficulty. The patient                            tolerated the procedure well. Scope In: Scope Out: Findings:                 Esophagogastric landmarks were identified: the                            Z-line was found at 35 cm, the gastroesophageal                            junction was found at 35 cm and the upper extent of                            the gastric folds was found at 37 cm from the                            incisors.                           A 2 cm hiatal hernia was present.                           One very mild benign-appearing, intrinsic stenosis                            (Shatski ring) was found at the GEJ, not dilated                            given asymptomatic and very mild.                           The exam of the esophagus was otherwise normal.                           The entire examined stomach was normal. Biopsies  were taken with a cold forceps for Helicobacter                            pylori testing.                           The duodenal bulb and second portion of the                            duodenum were normal. Complications:            No immediate complications. Estimated blood loss:                            Minimal. Estimated Blood Loss:     Estimated blood loss was minimal. Impression:               - Esophagogastric landmarks identified.                           - 2 cm hiatal hernia.                           - Benign very mild Shatski ring.                           - Normal stomach. Biopsied to rule out H pylori as                            a cause for the patient's symptoms.                           - Normal duodenal bulb and second portion of the                            duodenum. Recommendation:           - Patient has a contact number available for                            emergencies. The signs and symptoms of potential                            delayed complications were  discussed with the                            patient. Return to normal activities tomorrow.                            Written discharge instructions were provided to the                            patient.                           - Resume previous diet.                           -  Continue present medications.                           - Await pathology results.                           - No repeat upper endoscopy at this time. Remo Lipps P. Sanda Dejoy, MD 08/21/2015 10:38:05 AM This report has been signed electronically.

## 2015-08-21 NOTE — Progress Notes (Signed)
Called to room to assist during endoscopic procedure.  Patient ID and intended procedure confirmed with present staff. Received instructions for my participation in the procedure from the performing physician.  

## 2015-08-24 ENCOUNTER — Telehealth: Payer: Self-pay | Admitting: *Deleted

## 2015-08-24 NOTE — Telephone Encounter (Signed)
  Follow up Call-  Call back number 08/21/2015  Post procedure Call Back phone  # [phone]  Permission to leave phone message Yes     Patient questions:  Do you have a fever, pain , or abdominal swelling? No. Pain Score  0 *  Have you tolerated food without any problems? Yes.    Have you been able to return to your normal activities? Yes.    Do you have any questions about your discharge instructions: Diet   No. Medications  No. Follow up visit  No.  Do you have questions or concerns about your Care? No.  Actions: * If pain score is 4 or above: No action needed, pain <4.  Pt c/o sore throat, encouraged pt to call us back if symptoms worsen. She understands.

## 2015-09-01 ENCOUNTER — Telehealth: Payer: Self-pay | Admitting: *Deleted

## 2015-09-01 NOTE — Telephone Encounter (Signed)
Pt states she was trying to find Dr. Prudence Davidson, and this was his new phone number.  I told pt that the schedulers had left for the day, but I would have a scheduler call her early tomorrow morning.

## 2015-09-11 ENCOUNTER — Ambulatory Visit (INDEPENDENT_AMBULATORY_CARE_PROVIDER_SITE_OTHER): Payer: PPO

## 2015-09-11 ENCOUNTER — Ambulatory Visit: Payer: Self-pay

## 2015-09-11 ENCOUNTER — Encounter: Payer: Self-pay | Admitting: Podiatry

## 2015-09-11 ENCOUNTER — Ambulatory Visit (INDEPENDENT_AMBULATORY_CARE_PROVIDER_SITE_OTHER): Payer: PPO | Admitting: Podiatry

## 2015-09-11 VITALS — BP 127/82 | HR 76 | Resp 16 | Ht 60.0 in | Wt 138.0 lb

## 2015-09-11 DIAGNOSIS — M79672 Pain in left foot: Secondary | ICD-10-CM

## 2015-09-11 DIAGNOSIS — M6588 Other synovitis and tenosynovitis, other site: Secondary | ICD-10-CM

## 2015-09-11 DIAGNOSIS — M79671 Pain in right foot: Secondary | ICD-10-CM | POA: Diagnosis not present

## 2015-09-11 DIAGNOSIS — M722 Plantar fascial fibromatosis: Secondary | ICD-10-CM

## 2015-09-11 DIAGNOSIS — M7752 Other enthesopathy of left foot: Secondary | ICD-10-CM

## 2015-09-11 NOTE — Addendum Note (Signed)
Addended by: Gardiner Barefoot on: 09/11/2015 07:12 PM   Modules accepted: Level of Service

## 2015-09-11 NOTE — Progress Notes (Addendum)
   Subjective:    Patient ID: Rhonda Burns, female    DOB: 06/03/1945, 70 y.o.   MRN: [id number]  HPI this patient presents to the office with chief complaint of a painful left foot as well as her right foot. Her left foot is more painful and has been hurting for 3 weeks right foot has been occurring for approximately 2 weeks. She states the pain is in her foot and extends into the ankles, especially the left ankle. She points to the area of the outside ankle bone as the site of significant pain. She presented to my office at [facility] years ago and was treated with pure stride insoles, which worked well and her pain resolved. She says that since that time, the insoles have been worn down and she has not worn any support in her shoes. She walks 3 times daily at the [facility]. She has no history of trauma or reinjury to the foot. She presents the office today for an evaluation and treatment of her feet    Review of Systems  HENT: Positive for trouble swallowing.   Gastrointestinal: Positive for abdominal pain.  Musculoskeletal: Positive for arthralgias.  Neurological: Positive for light-headedness.       Objective:   Physical Exam GENERAL APPEARANCE: Alert, conversant. Appropriately groomed. No acute distress.  VASCULAR: Pedal pulses are  palpable at  [facility] and PT bilateral.  Capillary refill time is immediate to all digits,  Normal temperature gradient.  Digital hair growth is present bilateral  NEUROLOGIC: sensation is normal to 5.07 monofilament at 5/5 sites bilateral.  Light touch is intact bilateral, Muscle strength normal.  MUSCULOSKELETAL: acceptable muscle strength, tone and stability bilateral.  Intrinsic muscluature intact bilateral.  Rectus appearance of foot and digits noted bilateral. Palpable pain through arch both feet.  No pain at the insertion plantar fascia.  No pain sinus tarsi B/L.  Pain at level of lateral malleoli B/L.  DERMATOLOGIC: skin color, texture,  and turgor are within normal limits.  No preulcerative lesions or ulcers  are seen, no interdigital maceration noted.  No open lesions present.  Digital nails are asymptomatic. No drainage noted.         Assessment & Plan:  Plantar fascitis B/L  Ankle tendinitis left greater than right.   IE  Xrays taken reveal calcification plantar fascia B/l.  Powersteps were dispensed.  Discussed footgear with patient.   Gardiner Barefoot DPM

## 2015-09-29 ENCOUNTER — Ambulatory Visit (INDEPENDENT_AMBULATORY_CARE_PROVIDER_SITE_OTHER): Payer: PPO | Admitting: Gastroenterology

## 2015-09-29 ENCOUNTER — Encounter: Payer: Self-pay | Admitting: Gastroenterology

## 2015-09-29 VITALS — BP 140/70 | HR 80 | Ht 59.5 in | Wt 138.0 lb

## 2015-09-29 DIAGNOSIS — G8929 Other chronic pain: Secondary | ICD-10-CM | POA: Insufficient documentation

## 2015-09-29 DIAGNOSIS — R1011 Right upper quadrant pain: Principal | ICD-10-CM

## 2015-09-29 DIAGNOSIS — R1010 Upper abdominal pain, unspecified: Secondary | ICD-10-CM | POA: Diagnosis not present

## 2015-09-29 MED ORDER — DESIPRAMINE HCL 25 MG PO TABS: ORAL_TABLET | ORAL | Status: DC

## 2015-09-29 NOTE — Patient Instructions (Signed)
We have sent the following medications to your pharmacy for you to pick up at your convenience: Despiramine 25 mg every night x 2 weeks, then increase to 50 mg every night thereafter  If you are age 70 or older, your body mass index should be between 23-30. Your Body mass index is 27.42 kg/(m^2). If this is out of the aforementioned range listed, please consider follow up with your Primary Care Provider.  If you are age 37 or younger, your body mass index should be between 19-25. Your Body mass index is 27.42 kg/(m^2). If this is out of the aformentioned range listed, please consider follow up with your Primary Care Provider.

## 2015-09-29 NOTE — Progress Notes (Signed)
HPI :  70 y/o female with h/o HTN, HLD, asthma, GERD, here for follow up for RUQ pain.   Patient has had some chronic RUQ pain for at least 10 years. She states initially she had a scan of her abdomen about 10 years ago or so done while living in [place], and told she had a "cyst" in her liver. She said over time this was followed with imaging and told it had resolved. Pain is in the RUQ mostly, but can occasoinally bother her in the left side as well. It feels like a pinching sensation. It is present mostly all the time, the severity can fluctuate over time. When not bothering her too much rated 5/10, and then 9/10 when severe. It is severe roughly daily - last minutes at a time. She does not think there is any relation to eating. She denies any postprandial worsening. She is eating well. No nausea or vomiting. She is not losing any weight, she has weight gain. She cannot think of anything to make the pain worse or elicit, and nothing can make it better. She has a history of GERD She takes prevacid once daily PRN, mostly for gas and belching, which doesn't really bother her too much.   She had an US of the abdomen on 06/21/15. It showed CBD of 1.1cm with normal GB and no stones in the GB. She had an [place] in 2010 with a normal CBD. She has had a prior colonoscopy in 2010, which she thinks showed no polyps. No FH of CRC. 2 aunts have had breast cancer, and cousin.   Since her last visit an MRCP was obtained showing common bile duct of 47mm, with no associated intrahepatic biliary ductal dilatation,and no evidence of choledocholithiasis, stricture or other cause of obstruction. This measurement is within normal limits for a patient this age, and radiology considered it to be benign. She then had an EGD 08/21/15 which showed a benign very mild Shatski ring, small hiatal hernia, normal stomach with biopsies showing no H pylori, and normal duodenum. She reports doing okay since her last visit with me. Her  RUQ remains as described above and is unchanged since her last visit. She is here to discuss management options.      Past Medical History  Diagnosis Date  . Hypertension   . Hyperlipidemia   . Osteoporosis   . Allergy   . Osteoporosis   . Allergic rhinitis, cause unspecified   . GERD (gastroesophageal reflux disease)   . Asthma   . Cataract     growing cataracts  . Hiatal hernia   . Schatzki's ring   . Abdominal pain, chronic, right upper quadrant      Past Surgical History  Procedure Laterality Date  . Cesarean section    . Colonoscopy     Family History  Problem Relation Age of Onset  . Alzheimer's disease Mother   . Heart disease Mother   . Stroke Mother   . Asthma Sister   . Heart disease Father   . Heart attack Father   . Diabetes Father   . Colon cancer Neg Hx   . Esophageal cancer Neg Hx    Social History  Substance Use Topics  . Smoking status: Former Research scientist (life sciences)  . Smokeless tobacco: Never Used  . Alcohol Use: No   Current Outpatient Prescriptions  Medication Sig Dispense Refill  . acetaminophen (TYLENOL) 500 MG tablet Take 500 mg by mouth every 8 (eight) hours as needed.    Marland Kitchen  albuterol (VENTOLIN HFA) 108 (90 BASE) MCG/ACT inhaler Inhale 2 puffs into the lungs every 6 (six) hours as needed for wheezing. 1 Inhaler 3  . aspirin 325 MG tablet Take 325 mg by mouth daily.    . Blood Pressure Monitoring (WRIST BLOOD PRESSURE MONITOR) MISC 1 Device by Does not apply route daily. 1 each 0  . calcium gluconate 500 MG tablet Take 500 mg by mouth daily.    . Cholecalciferol (D3 SUPER STRENGTH) 2000 UNITS CAPS Take by mouth once.    . fish oil-omega-3 fatty acids 1000 MG capsule Take 1 g by mouth daily.     . fluticasone (FLONASE) 50 MCG/ACT nasal spray Place 2 sprays into both nostrils daily as needed for allergies. 16 g 5  . lisinopril (PRINIVIL,ZESTRIL) 5 MG tablet Take 1 tablet (5 mg total) by mouth daily. 90 tablet 3  . pravastatin (PRAVACHOL) 40 MG tablet TAKE  1 TABLET DAILY. 90 tablet 3  . desipramine (NORPRAMIN) 25 MG tablet Take 1 tablet by mouth every night x 2 weeks, then increase to 2 tablets (50 mg) every night thereafter. 60 tablet 1   No current facility-administered medications for this visit.   Allergies  Allergen Reactions  . Other Swelling    Pecans and coconut     Review of Systems: All systems reviewed and negative except where noted in HPI.    Lab Results  Component Value Date   WBC 6.1 03/11/2015   HGB 12.9 03/11/2015   HCT 38.4 03/11/2015   MCV 87.5 03/11/2015   PLT 342 03/11/2015    Lab Results  Component Value Date   ALT 18 07/16/2015   AST 19 07/16/2015   ALKPHOS 74 07/16/2015   BILITOT 0.7 07/16/2015    Lab Results  Component Value Date   CREATININE 0.80 07/24/2015   BUN 13 03/11/2015   NA 142 03/11/2015   K 3.6 03/11/2015   CL 104 03/11/2015   CO2 27 03/11/2015     Physical Exam: BP 140/70 mmHg  Pulse 80  Ht 4' 11.5" (1.511 m)  Wt 138 lb (62.596 kg)  BMI 27.42 kg/m2 Constitutional: Pleasant,well-developed, female in no acute distress. HEENT: Normocephalic and atraumatic. Conjunctivae are normal. No scleral icterus. Neck supple.  Cardiovascular: Normal rate, regular rhythm.  Pulmonary/chest: Effort normal and breath sounds normal. No wheezing, rales or rhonchi. Abdominal: Soft, nondistended, nontender. Bowel sounds active throughout. There are no masses palpable. N Extremities: no edema Lymphadenopathy: No cervical adenopathy noted. Neurological: Alert and oriented to person place and time. Skin: Skin is warm and dry. No rashes noted. Psychiatric: Normal mood and affect. Behavior is normal.   ASSESSMENT AND PLAN: 70 y/o female presenting with chronic RUQ pain as outlined above - present for 10 years without clear precipitants and is present constantly. She has had imaging for this issue over the years (no old imaging available) which she states hasn't showed much of anything significant.  More recently an MRCP and EGD have not showed any pathology. Labs normal. Her symptoms are unchanged. She has chronic abdominal pain which I suspect may be musculoskeletal or neuropathic given this history and lack of pathology on workup otherwise. I discussed options moving forward. Recommend a trial of TCA at this time for chronic pain management and see if this provides benefit. Will try desipramine 25mg  q HS for 2 weeks, then increase to 50mg  qHS thereafter as tolerated. I discussed risks / benefits of this medication and she wanted to try it. She can  call for reassessment and update me in a month or so, or follow up PRN. She agreed.   Eudora Cellar, MD [facility] Pager [phone]

## 2015-12-24 DIAGNOSIS — Z1231 Encounter for screening mammogram for malignant neoplasm of breast: Secondary | ICD-10-CM | POA: Diagnosis not present

## 2015-12-24 DIAGNOSIS — Z803 Family history of malignant neoplasm of breast: Secondary | ICD-10-CM | POA: Diagnosis not present

## 2015-12-24 LAB — HM MAMMOGRAPHY: HM Mammogram: NORMAL (ref 0–4)

## 2015-12-29 DIAGNOSIS — H40013 Open angle with borderline findings, low risk, bilateral: Secondary | ICD-10-CM | POA: Diagnosis not present

## 2015-12-29 DIAGNOSIS — H2513 Age-related nuclear cataract, bilateral: Secondary | ICD-10-CM | POA: Diagnosis not present

## 2016-03-09 NOTE — Progress Notes (Signed)
Subjective:    Rhonda Burns is a 70 y.o. female who presents for Medicare Annual/Subsequent preventive examination.  Preventive Screening-Counseling & Management  Tobacco History  Smoking Status  . Former Smoker  Smokeless Tobacco  . Never Used     Problems Prior to Visit 1. HPL: on pravastatin 2. HTN: On lisinopril 3. Osteoporosis- H/o: now off fosamax for 6-7 years.  06/3011 vit D nml at 69 4. Abdominal pain which patient has had extensive GI workup including endoscopy and MRCP negative biopsies negative H. pylori. GI suspected her pain was musculoskeletal.  Current Problems (verified) Patient Active Problem List   Diagnosis Date Noted  . Abdominal pain, chronic, right upper quadrant 09/29/2015  . Chest pain 08/06/2013  . DOE (dyspnea on exertion) 08/06/2013  . Reactive airways dysfunction syndrome 09/27/2012  . Hypertension   . Hyperlipidemia   . Osteoporosis   . Allergic rhinitis, cause unspecified   . GERD (gastroesophageal reflux disease)     Medications Prior to Visit Current Outpatient Prescriptions on File Prior to Visit  Medication Sig Dispense Refill  . acetaminophen (TYLENOL) 500 MG tablet Take 500 mg by mouth every 8 (eight) hours as needed.    Marland Kitchen albuterol (VENTOLIN HFA) 108 (90 BASE) MCG/ACT inhaler Inhale 2 puffs into the lungs every 6 (six) hours as needed for wheezing. 1 Inhaler 3  . aspirin 325 MG tablet Take 325 mg by mouth daily.    . Blood Pressure Monitoring (WRIST BLOOD PRESSURE MONITOR) MISC 1 Device by Does not apply route daily. 1 each 0  . calcium gluconate 500 MG tablet Take 500 mg by mouth daily.    . Cholecalciferol (D3 SUPER STRENGTH) 2000 UNITS CAPS Take by mouth once.    . desipramine (NORPRAMIN) 25 MG tablet Take 1 tablet by mouth every night x 2 weeks, then increase to 2 tablets (50 mg) every night thereafter. 60 tablet 1  . fish oil-omega-3 fatty acids 1000 MG capsule Take 1 g by mouth daily.     . fluticasone (FLONASE) 50 MCG/ACT nasal  spray Place 2 sprays into both nostrils daily as needed for allergies. 16 g 5  . lisinopril (PRINIVIL,ZESTRIL) 5 MG tablet Take 1 tablet (5 mg total) by mouth daily. 90 tablet 3  . pravastatin (PRAVACHOL) 40 MG tablet TAKE 1 TABLET DAILY. 90 tablet 3   No current facility-administered medications on file prior to visit.     Current Medications (verified) Current Outpatient Prescriptions  Medication Sig Dispense Refill  . acetaminophen (TYLENOL) 500 MG tablet Take 500 mg by mouth every 8 (eight) hours as needed.    Marland Kitchen albuterol (VENTOLIN HFA) 108 (90 BASE) MCG/ACT inhaler Inhale 2 puffs into the lungs every 6 (six) hours as needed for wheezing. 1 Inhaler 3  . aspirin 325 MG tablet Take 325 mg by mouth daily.    . Blood Pressure Monitoring (WRIST BLOOD PRESSURE MONITOR) MISC 1 Device by Does not apply route daily. 1 each 0  . calcium gluconate 500 MG tablet Take 500 mg by mouth daily.    . Cholecalciferol (D3 SUPER STRENGTH) 2000 UNITS CAPS Take by mouth once.    . desipramine (NORPRAMIN) 25 MG tablet Take 1 tablet by mouth every night x 2 weeks, then increase to 2 tablets (50 mg) every night thereafter. 60 tablet 1  . fish oil-omega-3 fatty acids 1000 MG capsule Take 1 g by mouth daily.     . fluticasone (FLONASE) 50 MCG/ACT nasal spray Place 2 sprays into both nostrils  daily as needed for allergies. 16 g 5  . lisinopril (PRINIVIL,ZESTRIL) 5 MG tablet Take 1 tablet (5 mg total) by mouth daily. 90 tablet 3  . pravastatin (PRAVACHOL) 40 MG tablet TAKE 1 TABLET DAILY. 90 tablet 3   No current facility-administered medications for this visit.      Allergies (verified) Other   PAST HISTORY  Family History Family History  Problem Relation Age of Onset  . Alzheimer's disease Mother   . Heart disease Mother   . Stroke Mother   . Asthma Sister   . Heart disease Father   . Heart attack Father   . Diabetes Father   . Colon cancer Neg Hx   . Esophageal cancer Neg Hx     Social  History Social History  Substance Use Topics  . Smoking status: Former Research scientist (life sciences)  . Smokeless tobacco: Never Used  . Alcohol use No     Are there smokers in your home (other than you)? No - lives alone  Risk Factors Current exercise habits: Home exercise routine includes walking 1-2 hrs per day.  [place] Dietary issues discussed: tried to not eat fried foods, cheese, and do baked and salads. Does drink a lot of juice.  Cardiac risk factors: advanced age (older than 61 for men, 77 for women), dyslipidemia and hypertension.  Depression Screen Depression screen [facility] 2/9 03/10/2016 05/27/2015 03/11/2015 06/24/2014 03/07/2014  Decreased Interest 0 0 0 0 0  Down, Depressed, Hopeless 0 0 0 0 0  PHQ - 2 Score 0 0 0 0 0   Activities of Daily Living In your present state of health, do you have any difficulty performing the following activities?:  Driving? No Managing money?  No Feeding yourself? No Getting from bed to chair? No Climbing a flight of stairs? No Preparing food and eating?: No Bathing or showering? No Getting dressed: No Getting to the toilet? No Using the toilet:No Moving around from place to place: No In the past year have you fallen or had a near fall?:No   Are you sexually active?  No  Do you have more than one partner?  No  Hearing Difficulties: Yes - still can hear great but not as acute as it used to be Do you often ask people to speak up or repeat themselves? No Do you experience ringing or noises in your ears? No Do you have difficulty understanding soft or whispered voices? No   Do you feel that you have a problem with memory? Yes  Do you often misplace items? No  Do you feel safe at home?  Yes  Cognitive Testing  Alert? Yes  Normal Appearance?Yes  Oriented to person? Yes  Place? Yes   Time? Yes  Recall of three objects?  Yes  Can perform simple calculations? Yes  Displays appropriate judgment?Yes  Can read the correct time from a watch  face?Yes   Advanced Directives have been discussed with the patient? Yes  - daughter Concha [place] om [employer], then brother, then cousin and does have living will at home as well.  List the Names of Other Physician/Practitioners you currently use: 1.  Cardiology Dr. Fransico Him 2. GI Dr. Jannifer Hick. [place] 3. Podiatry Dr. Prudence Davidson 4.  Dentist: Dr. Alphonzo Dublin in [facility] 5. [facility] Dr. Verdell Carmine 0-   Indicate any recent Medical Services you may have received from other than [facility] providers in the past year (date may be approximate).  Immunization History  Administered Date(s) Administered  . Pneumococcal  Conjugate-13 03/11/2015  . Pneumococcal Polysaccharide-23 03/10/2009, 03/11/2014  . Td 07/04/2006  . Zoster 09/28/2012    Screening Tests Health Maintenance  Topic Date Due  . INFLUENZA VACCINE  03/10/2016 (Originally 11/24/2015)  . TETANUS/TDAP  07/03/2016  . MAMMOGRAM  12/23/2017  . COLONOSCOPY  07/25/2018  . DEXA SCAN  Completed  . ZOSTAVAX  Completed  . Hepatitis C Screening  Completed    All answers were reviewed with the patient and necessary referrals were made:  Tristan Bramble, MD   03/09/2016   History reviewed: allergies, current medications, past family history, past medical history, past social history, past surgical history and problem list  Review of Systems A comprehensive review of systems was negative except for: Eyes: positive for irritation and visual disturbance Ears, nose, mouth, throat, and face: positive for hearing loss and sneezing Respiratory: positive for cough Gastrointestinal: positive for abdominal pain Musculoskeletal: positive for myalgias and neck pain Neurological: positive for dizziness Allergic/Immunologic: positive for hay fever and food allergies    Objective:       Visual Acuity Screening   Right eye Left eye Both eyes  Without correction:     With correction: 20/20-1 20/20 20/20   BP 120/64   Pulse 77   Temp 98.4 F (36.9 C)  (Oral)   Resp 18   Ht 4' 11.5" (1.511 m)   Wt 136 lb 12.8 oz (62.1 kg)   SpO2 97%   BMI 27.17 kg/m   BP 120/64   Pulse 77   Temp 98.4 F (36.9 C) (Oral)   Resp 18   Ht 4' 11.5" (1.511 m)   Wt 136 lb 12.8 oz (62.1 kg)   SpO2 97%   BMI 27.17 kg/m   General Appearance:    Alert, cooperative, no distress, appears stated age  Head:    Normocephalic, without obvious abnormality, atraumatic  Eyes:    PERRL, conjunctiva/corneas clear, EOM's intact, fundi    benign, both eyes  Ears:    Normal TM's and external ear canals, both ears  Nose:   Nares normal, septum midline, mucosa normal, no drainage    or sinus tenderness  Throat:   Lips, mucosa, and tongue normal; teeth and gums normal  Neck:   Supple, symmetrical, trachea midline, no adenopathy;    thyroid:  no enlargement/tenderness/nodules; no carotid   bruit or JVD  Back:     Symmetric, no curvature, ROM normal, no CVA tenderness  Lungs:     Clear to auscultation bilaterally, respirations unlabored  Chest Wall:    No tenderness or deformity   Heart:    Regular rate and rhythm, S1 and S2 normal, no murmur, rub   or gallop  Breast Exam:    No tenderness, masses, or nipple abnormality  Abdomen:     Soft, non-tender, bowel sounds active all four quadrants,    no masses, no organomegaly        Extremities:   Extremities normal, atraumatic, no cyanosis or edema  Pulses:   2+ and symmetric all extremities  Skin:   Skin color, texture, turgor normal, no rashes or lesions  Lymph nodes:   Cervical, supraclavicular, and axillary nodes normal  Neurologic:   CNII-XII intact, normal strength, sensation and reflexes    throughout    Patient does have small ear canals with right greater than left cerumen impaction. Offered lavage but forgot to do this before patient left.     Assessment:     1. Medicare annual wellness visit, subsequent  2. Encounter for screening mammogram for breast cancer   3. Screening for cardiovascular,  respiratory, and genitourinary diseases   4. Screening for colorectal cancer   5. Screening for deficiency anemia   6. Screening for thyroid disorder   7. History of osteoporosis   8. Essential hypertension   9. Hyperlipidemia, unspecified hyperlipidemia type   10. Chronic RUQ pain   11. Reactive airways dysfunction syndrome, unspecified asthma severity, uncomplicated        Plan:     During the course of the visit the patient was educated and counseled about appropriate screening and preventive services including:    Immunizations: Needs tetanus with next injury or bite. Refuses flu shot. Both pneumonia vaccines and Zostavax are up-to-date.  Cervical cancer: Shouldn't not need any further screens as Pap smear at 70 years old was normal  Breast cancer: Mammogram done 12/24/2015 at [facility] normal  Colorectal screening: Colonoscopy done 2010 normal  Bone density screen: DEXA scan done November 2016 was actually normal despite prior history of osteoporosis diagnosis and long-term use of Fosamax. Patient's L-spine had did generated enough that it was unusable and I do not see frax calculation.  Takes chewable calcium and D3, krill oil, asa  Diet review for nutrition referral? Yes ____  Not Indicated _x___   Patient Instructions (the written plan) was given to the patient.  Medicare Attestation I have personally reviewed: The patient's medical and social history Their use of alcohol, tobacco or illicit drugs Their current medications and supplements The patient's functional ability including ADLs,fall risks, home safety risks, cognitive, and hearing and visual impairment Diet and physical activities Evidence for depression or mood disorders  The patient's weight, height, BMI, and visual acuity have been recorded in the chart.  I have made referrals, counseling, and provided education to the patient based on review of the above and I have provided the patient with a written  personalized care plan for preventive services.     Delman Cheadle, MD   03/09/2016    Orders Placed This Encounter  Procedures  . CBC  . Comprehensive metabolic panel    Order Specific Question:   Has the patient fasted?    Answer:   Yes  . TSH  . Lipid panel    Order Specific Question:   Has the patient fasted?    Answer:   Yes  . Lipase  . POCT urinalysis dipstick    Meds ordered this encounter  Medications  . DISCONTD: lisinopril (PRINIVIL,ZESTRIL) 5 MG tablet    Sig: Take 1 tablet (5 mg total) by mouth daily.    Dispense:  90 tablet    Refill:  3  . DISCONTD: pravastatin (PRAVACHOL) 40 MG tablet    Sig: TAKE 1 TABLET DAILY.    Dispense:  90 tablet    Refill:  3  . triamcinolone cream (KENALOG) 0.1 %    Sig: Apply 1 application topically 2 (two) times daily.    Dispense:  80 g    Refill:  1  . fluticasone (FLONASE) 50 MCG/ACT nasal spray    Sig: Place 2 sprays into both nostrils daily as needed for allergies.    Dispense:  16 g    Refill:  5  . albuterol (VENTOLIN HFA) 108 (90 Base) MCG/ACT inhaler    Sig: Inhale 2 puffs into the lungs every 6 (six) hours as needed for wheezing.    Dispense:  1 Inhaler    Refill:  1  . lisinopril (PRINIVIL,ZESTRIL) 5  MG tablet    Sig: Take 1 tablet (5 mg total) by mouth daily.    Dispense:  90 tablet    Refill:  3  . pravastatin (PRAVACHOL) 40 MG tablet    Sig: TAKE 1 TABLET DAILY.    Dispense:  90 tablet    Refill:  3    Delman Cheadle, M.D.  [facility] [address] [place], [place] [address] [phone] phone [phone] fax  03/14/16 12:37 PM

## 2016-03-10 ENCOUNTER — Encounter: Payer: Self-pay | Admitting: Family Medicine

## 2016-03-10 ENCOUNTER — Ambulatory Visit (INDEPENDENT_AMBULATORY_CARE_PROVIDER_SITE_OTHER): Payer: PPO | Admitting: Family Medicine

## 2016-03-10 VITALS — BP 120/64 | HR 77 | Temp 98.4°F | Resp 18 | Ht 59.5 in | Wt 136.8 lb

## 2016-03-10 DIAGNOSIS — R1011 Right upper quadrant pain: Secondary | ICD-10-CM | POA: Diagnosis not present

## 2016-03-10 DIAGNOSIS — Z1231 Encounter for screening mammogram for malignant neoplasm of breast: Secondary | ICD-10-CM

## 2016-03-10 DIAGNOSIS — G8929 Other chronic pain: Secondary | ICD-10-CM | POA: Diagnosis not present

## 2016-03-10 DIAGNOSIS — Z136 Encounter for screening for cardiovascular disorders: Secondary | ICD-10-CM

## 2016-03-10 DIAGNOSIS — Z0000 Encounter for general adult medical examination without abnormal findings: Secondary | ICD-10-CM

## 2016-03-10 DIAGNOSIS — Z1329 Encounter for screening for other suspected endocrine disorder: Secondary | ICD-10-CM | POA: Diagnosis not present

## 2016-03-10 DIAGNOSIS — Z1383 Encounter for screening for respiratory disorder NEC: Secondary | ICD-10-CM

## 2016-03-10 DIAGNOSIS — Z8739 Personal history of other diseases of the musculoskeletal system and connective tissue: Secondary | ICD-10-CM | POA: Diagnosis not present

## 2016-03-10 DIAGNOSIS — I10 Essential (primary) hypertension: Secondary | ICD-10-CM | POA: Diagnosis not present

## 2016-03-10 DIAGNOSIS — Z1211 Encounter for screening for malignant neoplasm of colon: Secondary | ICD-10-CM

## 2016-03-10 DIAGNOSIS — Z1389 Encounter for screening for other disorder: Secondary | ICD-10-CM

## 2016-03-10 DIAGNOSIS — Z1212 Encounter for screening for malignant neoplasm of rectum: Secondary | ICD-10-CM

## 2016-03-10 DIAGNOSIS — J45909 Unspecified asthma, uncomplicated: Secondary | ICD-10-CM | POA: Diagnosis not present

## 2016-03-10 DIAGNOSIS — Z130 Encounter for screening for diseases of the blood and blood-forming organs and certain disorders involving the immune mechanism: Secondary | ICD-10-CM | POA: Diagnosis not present

## 2016-03-10 DIAGNOSIS — E785 Hyperlipidemia, unspecified: Secondary | ICD-10-CM | POA: Diagnosis not present

## 2016-03-10 DIAGNOSIS — J683 Other acute and subacute respiratory conditions due to chemicals, gases, fumes and vapors: Secondary | ICD-10-CM

## 2016-03-10 LAB — CBC
HCT: 37.3 % (ref 35.0–45.0)
HEMOGLOBIN: 12.5 g/dL (ref 11.7–15.5)
MCH: 29.8 pg (ref 27.0–33.0)
MCHC: 33.5 g/dL (ref 32.0–36.0)
MCV: 89 fL (ref 80.0–100.0)
MPV: 9.7 fL (ref 7.5–12.5)
Platelets: 320 10*3/uL (ref 140–400)
RBC: 4.19 MIL/uL (ref 3.80–5.10)
RDW: 13.1 % (ref 11.0–15.0)
WBC: 4.8 10*3/uL (ref 3.8–10.8)

## 2016-03-10 LAB — LIPASE: LIPASE: 11 U/L (ref 7–60)

## 2016-03-10 LAB — LIPID PANEL
CHOLESTEROL: 128 mg/dL (ref <200)
HDL: 62 mg/dL (ref >50)
LDL Cholesterol: 44 mg/dL (ref <100)
Total CHOL/HDL Ratio: 2.1 Ratio (ref <5.0)
Triglycerides: 109 mg/dL (ref <150)
VLDL: 22 mg/dL (ref <30)

## 2016-03-10 LAB — POCT URINALYSIS DIP (MANUAL ENTRY)
BILIRUBIN UA: NEGATIVE
Bilirubin, UA: NEGATIVE
Glucose, UA: NEGATIVE
LEUKOCYTES UA: NEGATIVE
NITRITE UA: NEGATIVE
PROTEIN UA: NEGATIVE
Spec Grav, UA: 1.02
UROBILINOGEN UA: 0.2
pH, UA: 5.5

## 2016-03-10 LAB — COMPREHENSIVE METABOLIC PANEL
ALBUMIN: 4.3 g/dL (ref 3.6–5.1)
ALT: 21 U/L (ref 6–29)
AST: 23 U/L (ref 10–35)
Alkaline Phosphatase: 72 U/L (ref 33–130)
BILIRUBIN TOTAL: 0.7 mg/dL (ref 0.2–1.2)
BUN: 12 mg/dL (ref 7–25)
CO2: 28 mmol/L (ref 20–31)
CREATININE: 0.92 mg/dL (ref 0.60–0.93)
Calcium: 9.8 mg/dL (ref 8.6–10.4)
Chloride: 106 mmol/L (ref 98–110)
Glucose, Bld: 93 mg/dL (ref 65–99)
Potassium: 4 mmol/L (ref 3.5–5.3)
SODIUM: 141 mmol/L (ref 135–146)
TOTAL PROTEIN: 7.3 g/dL (ref 6.1–8.1)

## 2016-03-10 LAB — TSH: TSH: 1.57 m[IU]/L

## 2016-03-10 MED ORDER — TRIAMCINOLONE ACETONIDE 0.1 % EX CREA
1.0000 | TOPICAL_CREAM | Freq: Two times a day (BID) | CUTANEOUS | 1 refills | Status: DC
Start: 2016-03-10 — End: 2017-09-02

## 2016-03-10 MED ORDER — LISINOPRIL 5 MG PO TABS: 5.0000 mg | ORAL_TABLET | Freq: Every day | ORAL | 3 refills | Status: DC

## 2016-03-10 MED ORDER — FLUTICASONE PROPIONATE 50 MCG/ACT NA SUSP: 2.0000 | Freq: Every day | NASAL | 5 refills | Status: DC | PRN

## 2016-03-10 MED ORDER — ALBUTEROL SULFATE HFA 108 (90 BASE) MCG/ACT IN AERS: 2.0000 | INHALATION_SPRAY | Freq: Four times a day (QID) | RESPIRATORY_TRACT | 1 refills | Status: DC | PRN

## 2016-03-10 MED ORDER — PRAVASTATIN SODIUM 40 MG PO TABS: ORAL_TABLET | ORAL | 3 refills | Status: DC

## 2016-03-10 NOTE — Patient Instructions (Addendum)
     IF you received an x-ray today, you will receive an invoice from [facility]. Please contact [facility] at [phone] with questions or concerns regarding your invoice.   IF you received labwork today, you will receive an invoice from [employer]. Please contact [facility] at [phone] with questions or concerns regarding your invoice.   Our billing staff will not be able to assist you with questions regarding bills from these companies.  You will be contacted with the lab results as soon as they are available. The fastest way to get your results is to activate your My Chart account. Instructions are located on the last page of this paperwork. If you have not heard from [place] regarding the results in 2 weeks, please contact this office.      Ms. Brandli , Thank you for taking time to come for your Medicare Wellness Visit. I appreciate your ongoing commitment to your health goals. Please review the following plan we discussed and let me know if I can assist you in the future.   These are the goals we discussed: Goals    None      This is a list of the screening recommended for you and due dates:  Health Maintenance  Topic Date Due  . Flu Shot  03/10/2016*  . Tetanus Vaccine  07/03/2016  . Mammogram  12/23/2017  . Colon Cancer Screening  07/25/2018  . DEXA scan (bone density measurement)  Completed  . Shingles Vaccine  Completed  .  Hepatitis C: One time screening is recommended by Center for Disease Control  (CDC) for  adults born from [birth year] through 1965.   Completed  *Topic was postponed. The date shown is not the original due date.

## 2016-03-16 ENCOUNTER — Encounter: Payer: Medicare Other | Admitting: Family Medicine

## 2016-03-17 ENCOUNTER — Encounter: Payer: PPO | Admitting: Family Medicine

## 2016-08-30 ENCOUNTER — Telehealth: Payer: Self-pay | Admitting: Family Medicine

## 2016-08-30 DIAGNOSIS — J683 Other acute and subacute respiratory conditions due to chemicals, gases, fumes and vapors: Secondary | ICD-10-CM

## 2016-08-30 MED ORDER — ALBUTEROL SULFATE HFA 108 (90 BASE) MCG/ACT IN AERS: 2.0000 | INHALATION_SPRAY | Freq: Four times a day (QID) | RESPIRATORY_TRACT | 0 refills | Status: DC | PRN

## 2016-08-30 NOTE — Telephone Encounter (Signed)
Faxed to mail order. 

## 2016-08-30 NOTE — Telephone Encounter (Signed)
Pt called needing a refill on albuterol (VENTOLIN HFA) 108 (90 Base) MCG/ACT inhaler through [facility]   Please advise: [phone]

## 2016-08-30 NOTE — Telephone Encounter (Signed)
02/2016 last ov and refill 

## 2016-09-05 ENCOUNTER — Other Ambulatory Visit: Payer: Self-pay | Admitting: Family Medicine

## 2016-09-05 DIAGNOSIS — J683 Other acute and subacute respiratory conditions due to chemicals, gases, fumes and vapors: Secondary | ICD-10-CM

## 2016-09-08 MED ORDER — ALBUTEROL SULFATE HFA 108 (90 BASE) MCG/ACT IN AERS: 2.0000 | INHALATION_SPRAY | Freq: Four times a day (QID) | RESPIRATORY_TRACT | 0 refills | Status: DC | PRN

## 2016-09-09 ENCOUNTER — Telehealth: Payer: Self-pay | Admitting: Family Medicine

## 2016-09-09 DIAGNOSIS — J683 Other acute and subacute respiratory conditions due to chemicals, gases, fumes and vapors: Secondary | ICD-10-CM

## 2016-09-09 MED ORDER — ALBUTEROL SULFATE HFA 108 (90 BASE) MCG/ACT IN AERS: 2.0000 | INHALATION_SPRAY | Freq: Four times a day (QID) | RESPIRATORY_TRACT | 0 refills | Status: DC | PRN

## 2016-09-09 NOTE — Telephone Encounter (Signed)
Sent to mail order today, needs office vist

## 2016-09-09 NOTE — Telephone Encounter (Signed)
Pt is needing to check on the refill request for albuteral  Best number [phone]

## 2016-11-10 ENCOUNTER — Ambulatory Visit (INDEPENDENT_AMBULATORY_CARE_PROVIDER_SITE_OTHER): Payer: PPO | Admitting: Podiatry

## 2016-11-10 ENCOUNTER — Encounter: Payer: Self-pay | Admitting: Podiatry

## 2016-11-10 DIAGNOSIS — M205X1 Other deformities of toe(s) (acquired), right foot: Secondary | ICD-10-CM | POA: Diagnosis not present

## 2016-11-10 DIAGNOSIS — M2021 Hallux rigidus, right foot: Secondary | ICD-10-CM

## 2016-11-10 DIAGNOSIS — M722 Plantar fascial fibromatosis: Secondary | ICD-10-CM

## 2016-11-10 MED ORDER — MELOXICAM 15 MG PO TABS: 15.0000 mg | ORAL_TABLET | Freq: Every day | ORAL | 1 refills | Status: DC

## 2016-11-10 MED ORDER — MELOXICAM 15 MG PO TABS: 15.0000 mg | ORAL_TABLET | Freq: Every day | ORAL | 0 refills | Status: DC

## 2016-11-10 NOTE — Progress Notes (Signed)
   Subjective:    Patient ID: Rhonda Burns, female    DOB: 31-Dec-1945, 71 y.o.   MRN: [id number]  Foot Pain  Associated symptoms include abdominal pain and arthralgias.   This patient returns to the office follow-up for diagnosis of plantar fasciitis right heel. Patient has previously been treated for this condition on both feet with power step insoles.  She says that she had a long pain-free period of time but the pain in her right heel has been present for the last 2 months.  She has pain upon rising in the morning and standing from a sitting position.  She points to the center of her heel as the site of her pain.  She continues to be very active walking in [employer] daily.  She denies any injury or trauma to the foot.  She presents the office today for an evaluation and treatment of her painful right heel.   Review of Systems  HENT: Positive for trouble swallowing.   Gastrointestinal: Positive for abdominal pain.  Musculoskeletal: Positive for arthralgias.  Neurological: Positive for light-headedness.       Objective:   Physical Exam GENERAL APPEARANCE: Alert, conversant. Appropriately groomed. No acute distress.  VASCULAR: Pedal pulses are  palpable at  [facility] and PT bilateral.  Capillary refill time is immediate to all digits,  Normal temperature gradient.  Digital hair growth is present bilateral  NEUROLOGIC: sensation is normal to 5.07 monofilament at 5/5 sites bilateral.  Light touch is intact bilateral, Muscle strength normal.  MUSCULOSKELETAL: acceptable muscle strength, tone and stability bilateral.  Intrinsic muscluature intact bilateral.  Hallux limitus 1st MPJ  Right.  Palpable pain noted at the insertion of the plantar fascia of the right heel  DERMATOLOGIC: skin color, texture, and turgor are within normal limits.  No preulcerative lesions or ulcers  are seen, no interdigital maceration noted.  No open lesions present.  Digital nails are asymptomatic. No drainage  noted.         Assessment & Plan:  Plantar fascitis right heel.   ROV  patient presents for an evaluation of her plantar fasciitis. She says she is not interested in injection therapy.  We discussed a changing footgear, as well as purchase of a new pair of power step insoles.  Prescribed Mobic for this patient with one refill.  She is to return to the office if her pain persists for further treatment.   Gardiner Barefoot DPM   Gardiner Barefoot DPM

## 2016-12-30 ENCOUNTER — Ambulatory Visit: Payer: PPO | Admitting: Physician Assistant

## 2017-01-12 DIAGNOSIS — Z1231 Encounter for screening mammogram for malignant neoplasm of breast: Secondary | ICD-10-CM | POA: Diagnosis not present

## 2017-01-12 LAB — HM MAMMOGRAPHY

## 2017-01-13 DIAGNOSIS — H43813 Vitreous degeneration, bilateral: Secondary | ICD-10-CM | POA: Diagnosis not present

## 2017-01-13 DIAGNOSIS — H2513 Age-related nuclear cataract, bilateral: Secondary | ICD-10-CM | POA: Diagnosis not present

## 2017-01-13 DIAGNOSIS — H10413 Chronic giant papillary conjunctivitis, bilateral: Secondary | ICD-10-CM | POA: Diagnosis not present

## 2017-01-24 ENCOUNTER — Other Ambulatory Visit: Payer: Self-pay | Admitting: Podiatry

## 2017-01-26 ENCOUNTER — Other Ambulatory Visit: Payer: Self-pay | Admitting: Podiatry

## 2017-01-30 ENCOUNTER — Telehealth: Payer: Self-pay | Admitting: Podiatry

## 2017-01-30 NOTE — Telephone Encounter (Signed)
Pt called saying she saw Dr. Prudence Davidson a few months ago and he prescribed meloxicam for her. She tried to get a refill but was notified by her pharmacy that she was declined the refill and told she needed to make an appointment to see Dr. Prudence Davidson. I placed the pt on hold and spoke with Marcy Siren, RN while looking at her last office visit note from 19 July. Dr. Prudence Davidson stated if her pain persisted she would need to make a return appointment before refilling her prescription. When I got back on the phone with the pt and explained this to her I offered to transfer her to our appointment schedulers. Pt stated that was okay and said thank you.

## 2017-02-22 ENCOUNTER — Ambulatory Visit (INDEPENDENT_AMBULATORY_CARE_PROVIDER_SITE_OTHER): Payer: PPO | Admitting: Family Medicine

## 2017-02-22 ENCOUNTER — Ambulatory Visit (INDEPENDENT_AMBULATORY_CARE_PROVIDER_SITE_OTHER): Payer: PPO

## 2017-02-22 ENCOUNTER — Encounter: Payer: Self-pay | Admitting: Family Medicine

## 2017-02-22 VITALS — BP 142/80 | HR 80 | Temp 98.9°F | Resp 16 | Ht 61.02 in | Wt 140.0 lb

## 2017-02-22 DIAGNOSIS — S134XXA Sprain of ligaments of cervical spine, initial encounter: Secondary | ICD-10-CM | POA: Diagnosis not present

## 2017-02-22 DIAGNOSIS — M79621 Pain in right upper arm: Secondary | ICD-10-CM | POA: Diagnosis not present

## 2017-02-22 DIAGNOSIS — M5412 Radiculopathy, cervical region: Secondary | ICD-10-CM

## 2017-02-22 DIAGNOSIS — G8929 Other chronic pain: Secondary | ICD-10-CM | POA: Diagnosis not present

## 2017-02-22 DIAGNOSIS — R1031 Right lower quadrant pain: Secondary | ICD-10-CM | POA: Diagnosis not present

## 2017-02-22 DIAGNOSIS — S199XXA Unspecified injury of neck, initial encounter: Secondary | ICD-10-CM | POA: Diagnosis not present

## 2017-02-22 DIAGNOSIS — M25511 Pain in right shoulder: Secondary | ICD-10-CM | POA: Diagnosis not present

## 2017-02-22 DIAGNOSIS — M542 Cervicalgia: Secondary | ICD-10-CM | POA: Diagnosis not present

## 2017-02-22 DIAGNOSIS — R102 Pelvic and perineal pain: Secondary | ICD-10-CM | POA: Diagnosis not present

## 2017-02-22 LAB — POCT URINALYSIS DIP (MANUAL ENTRY)
Bilirubin, UA: NEGATIVE
Glucose, UA: NEGATIVE mg/dL
Ketones, POC UA: NEGATIVE mg/dL
Leukocytes, UA: NEGATIVE
NITRITE UA: NEGATIVE
PH UA: 6.5 (ref 5.0–8.0)
Protein Ur, POC: NEGATIVE mg/dL
RBC UA: NEGATIVE
Spec Grav, UA: 1.025 (ref 1.010–1.025)
UROBILINOGEN UA: 0.2 U/dL

## 2017-02-22 LAB — POC MICROSCOPIC URINALYSIS (UMFC): Mucus: ABSENT

## 2017-02-22 MED ORDER — MELOXICAM 15 MG PO TABS: 15.0000 mg | ORAL_TABLET | Freq: Every day | ORAL | 0 refills | Status: DC

## 2017-02-22 MED ORDER — PREDNISONE 10 MG PO TABS: ORAL_TABLET | ORAL | 0 refills | Status: DC
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
## 2017-02-22 NOTE — Patient Instructions (Addendum)
IF you received an x-ray today, you will receive an invoice from [facility]. Please contact [facility] at [phone] with questions or concerns regarding your invoice.   IF you received labwork today, you will receive an invoice from [facility]. Please contact [facility] at [phone] with questions or concerns regarding your invoice.   Our billing staff will not be able to assist you with questions regarding bills from these companies.  You will be contacted with the lab results as soon as they are available. The fastest way to get your results is to activate your My Chart account. Instructions are located on the last page of this paperwork. If you have not heard from [place] regarding the results in 2 weeks, please contact this office.      Cervical Radiculopathy Cervical radiculopathy happens when a nerve in the neck (cervical nerve) is pinched or bruised. This condition can develop because of an injury or as part of the normal aging process. Pressure on the cervical nerves can cause pain or numbness that runs from the neck all the way down into the arm and fingers. Usually, this condition gets better with rest. Treatment may be needed if the condition does not improve. What are the causes? This condition may be caused by:  Injury.  Slipped (herniated) disk.  Muscle tightness in the neck because of overuse.  Arthritis.  Breakdown or degeneration in the bones and joints of the spine (spondylosis) due to aging.  Bone spurs that may develop near the cervical nerves.  What are the signs or symptoms? Symptoms of this condition include:  Pain that runs from the neck to the arm and hand. The pain can be severe or irritating. It may be worse when the neck is moved.  Numbness or weakness in the affected arm and hand.  How is this diagnosed? This condition may be diagnosed based on symptoms, medical history, and a physical exam. You may also have tests,  including:  X-rays.  CT scan.  MRI.  Electromyogram (EMG).  Nerve conduction tests.  How is this treated? In many cases, treatment is not needed for this condition. With rest, the condition usually gets better over time. If treatment is needed, options may include:  Wearing a soft neck collar for short periods of time.  Physical therapy to strengthen your neck muscles.  Medicines, such as NSAIDs, oral corticosteroids, or spinal injections.  Surgery. This may be needed if other treatments do not help. Various types of surgery may be done depending on the cause of your problems.  Follow these instructions at home: Managing pain  Take over-the-counter and prescription medicines only as told by your health care provider.  If directed, apply ice to the affected area. ? Put ice in a plastic bag. ? Place a towel between your skin and the bag. ? Leave the ice on for 20 minutes, 2-3 times per day.  If ice does not help, you can try using heat. Take a warm shower or warm bath, or use a heat pack as told by your health care provider.  Try a gentle neck and shoulder massage to help relieve symptoms. Activity  Rest as needed. Follow instructions from your health care provider about any restrictions on activities.  Do stretching and strengthening exercises as told by your health care provider or physical therapist. General instructions  If you were given a soft collar, wear it as told by your health care provider.  Use a flat pillow when you  sleep.  Keep all follow-up visits as told by your health care provider. This is important. Contact a health care provider if:  Your condition does not improve with treatment. Get help right away if:  Your pain gets much worse and cannot be controlled with medicines.  You have weakness or numbness in your hand, arm, face, or leg.  You have a high fever.  You have a stiff, rigid neck.  You lose control of your bowels or your bladder  (have incontinence).  You have trouble with walking, balance, or speaking. This information is not intended to replace advice given to you by your health care provider. Make sure you discuss any questions you have with your health care provider. Document Released: 01/04/2001 Document Revised: 09/17/2015 Document Reviewed: 06/05/2014 Elsevier Interactive Patient Education  2018 [employer].  Cervical Strain and Sprain Rehab Ask your health care provider which exercises are safe for you. Do exercises exactly as told by your health care provider and adjust them as directed. It is normal to feel mild stretching, pulling, tightness, or discomfort as you do these exercises, but you should stop right away if you feel sudden pain or your pain gets worse.Do not begin these exercises until told by your health care provider. Stretching and range of motion exercises These exercises warm up your muscles and joints and improve the movement and flexibility of your neck. These exercises also help to relieve pain, numbness, and tingling. Exercise A: Cervical side bend  1. Using good posture, sit on a stable chair or stand up. 2. Without moving your shoulders, slowly tilt your left / right ear to your shoulder until you feel a stretch in your neck muscles. You should be looking straight ahead. 3. Hold for __________ seconds. 4. Repeat with the other side of your neck. Repeat __________ times. Complete this exercise __________ times a day. Exercise B: Cervical rotation  1. Using good posture, sit on a stable chair or stand up. 2. Slowly turn your head to the side as if you are looking over your left / right shoulder. ? Keep your eyes level with the ground. ? Stop when you feel a stretch along the side and the back of your neck. 3. Hold for __________ seconds. 4. Repeat this by turning to your other side. Repeat __________ times. Complete this exercise __________ times a day. Exercise C: Thoracic extension  and pectoral stretch 1. Roll a towel or a small blanket so it is about 4 inches (10 cm) in diameter. 2. Lie down on your back on a firm surface. 3. Put the towel lengthwise, under your spine in the middle of your back. It should not be not under your shoulder blades. The towel should line up with your spine from your middle back to your lower back. 4. Put your hands behind your head and let your elbows fall out to your sides. 5. Hold for __________ seconds. Repeat __________ times. Complete this exercise __________ times a day. Strengthening exercises These exercises build strength and endurance in your neck. Endurance is the ability to use your muscles for a long time, even after your muscles get tired. Exercise D: Upper cervical flexion, isometric 1. Lie on your back with a thin pillow behind your head and a small rolled-up towel under your neck. 2. Gently tuck your chin toward your chest and nod your head down to look toward your feet. Do not lift your head off the pillow. 3. Hold for __________ seconds. 4. Release the  tension slowly. Relax your neck muscles completely before you repeat this exercise. Repeat __________ times. Complete this exercise __________ times a day. Exercise E: Cervical extension, isometric  1. Stand about 6 inches (15 cm) away from a wall, with your back facing the wall. 2. Place a soft object, about 6-8 inches (15-20 cm) in diameter, between the back of your head and the wall. A soft object could be a small pillow, a ball, or a folded towel. 3. Gently tilt your head back and press into the soft object. Keep your jaw and forehead relaxed. 4. Hold for __________ seconds. 5. Release the tension slowly. Relax your neck muscles completely before you repeat this exercise. Repeat __________ times. Complete this exercise __________ times a day. Posture and body mechanics  Body mechanics refers to the movements and positions of your body while you do your daily activities.  Posture is part of body mechanics. Good posture and healthy body mechanics can help to relieve stress in your body's tissues and joints. Good posture means that your spine is in its natural S-curve position (your spine is neutral), your shoulders are pulled back slightly, and your head is not tipped forward. The following are general guidelines for applying improved posture and body mechanics to your everyday activities. Standing  When standing, keep your spine neutral and keep your feet about hip-width apart. Keep a slight bend in your knees. Your ears, shoulders, and hips should line up.  When you do a task in which you stand in one place for a long time, place one foot up on a stable object that is 2-4 inches (5-10 cm) high, such as a footstool. This helps keep your spine neutral. Sitting   When sitting, keep your spine neutral and your keep feet flat on the floor. Use a footrest, if necessary, and keep your thighs parallel to the floor. Avoid rounding your shoulders, and avoid tilting your head forward.  When working at a desk or a computer, keep your desk at a height where your hands are slightly lower than your elbows. Slide your chair under your desk so you are close enough to maintain good posture.  When working at a computer, place your monitor at a height where you are looking straight ahead and you do not have to tilt your head forward or downward to look at the screen. Resting When lying down and resting, avoid positions that are most painful for you. Try to support your neck in a neutral position. You can use a contour pillow or a small rolled-up towel. Your pillow should support your neck but not push on it. This information is not intended to replace advice given to you by your health care provider. Make sure you discuss any questions you have with your health care provider. Document Released: 04/11/2005 Document Revised: 12/17/2015 Document Reviewed: 03/18/2015 Elsevier Interactive  Patient Education  [employer].

## 2017-03-09 ENCOUNTER — Ambulatory Visit (HOSPITAL_COMMUNITY)
Admission: RE | Admit: 2017-03-09 | Discharge: 2017-03-09 | Disposition: A | Discharge status: Home / Self Care | Payer: PPO | Source: Ambulatory Visit | Attending: Family Medicine | Admitting: Family Medicine

## 2017-03-09 DIAGNOSIS — G8929 Other chronic pain: Secondary | ICD-10-CM | POA: Diagnosis not present

## 2017-03-09 DIAGNOSIS — N83201 Unspecified ovarian cyst, right side: Secondary | ICD-10-CM | POA: Insufficient documentation

## 2017-03-09 DIAGNOSIS — R1031 Right lower quadrant pain: Secondary | ICD-10-CM | POA: Insufficient documentation

## 2017-03-10 ENCOUNTER — Ambulatory Visit (INDEPENDENT_AMBULATORY_CARE_PROVIDER_SITE_OTHER): Payer: PPO

## 2017-03-10 VITALS — BP 130/72 | HR 86 | Ht 61.0 in | Wt 137.1 lb

## 2017-03-10 DIAGNOSIS — E785 Hyperlipidemia, unspecified: Secondary | ICD-10-CM | POA: Diagnosis not present

## 2017-03-10 DIAGNOSIS — Z0000 Encounter for general adult medical examination without abnormal findings: Secondary | ICD-10-CM | POA: Diagnosis not present

## 2017-03-10 DIAGNOSIS — I10 Essential (primary) hypertension: Secondary | ICD-10-CM

## 2017-03-10 DIAGNOSIS — N83201 Unspecified ovarian cyst, right side: Secondary | ICD-10-CM | POA: Diagnosis not present

## 2017-03-10 LAB — COMPREHENSIVE METABOLIC PANEL
A/G RATIO: 1.6 (ref 1.2–2.2)
ALK PHOS: 79 IU/L (ref 39–117)
ALT: 16 IU/L (ref 0–32)
AST: 18 IU/L (ref 0–40)
Albumin: 4.4 g/dL (ref 3.5–4.8)
BUN/Creatinine Ratio: 15 (ref 12–28)
BUN: 13 mg/dL (ref 8–27)
Bilirubin Total: 0.7 mg/dL (ref 0.0–1.2)
CO2: 25 mmol/L (ref 20–29)
CREATININE: 0.85 mg/dL (ref 0.57–1.00)
Calcium: 10 mg/dL (ref 8.7–10.3)
Chloride: 106 mmol/L (ref 96–106)
GFR calc Af Amer: 80 mL/min/{1.73_m2} (ref >59)
GFR calc non Af Amer: 69 mL/min/{1.73_m2} (ref >59)
Globulin, Total: 2.7 g/dL (ref 1.5–4.5)
Glucose: 91 mg/dL (ref 65–99)
POTASSIUM: 4.2 mmol/L (ref 3.5–5.2)
SODIUM: 144 mmol/L (ref 134–144)
Total Protein: 7.1 g/dL (ref 6.0–8.5)

## 2017-03-10 LAB — CBC WITH DIFFERENTIAL/PLATELET
Basophils Absolute: 0 10*3/uL (ref 0.0–0.2)
Basos: 0 %
EOS (ABSOLUTE): 0.2 10*3/uL (ref 0.0–0.4)
EOS: 4 %
HEMATOCRIT: 37.6 % (ref 34.0–46.6)
HEMOGLOBIN: 12.8 g/dL (ref 11.1–15.9)
Immature Grans (Abs): 0 10*3/uL (ref 0.0–0.1)
Immature Granulocytes: 0 %
LYMPHS ABS: 2.3 10*3/uL (ref 0.7–3.1)
Lymphs: 34 %
MCH: 29.8 pg (ref 26.6–33.0)
MCHC: 34 g/dL (ref 31.5–35.7)
MCV: 87 fL (ref 79–97)
MONOCYTES: 6 %
Monocytes Absolute: 0.4 10*3/uL (ref 0.1–0.9)
NEUTROS ABS: 3.8 10*3/uL (ref 1.4–7.0)
Neutrophils: 56 %
Platelets: 300 10*3/uL (ref 150–379)
RBC: 4.3 x10E6/uL (ref 3.77–5.28)
RDW: 13.6 % (ref 12.3–15.4)
WBC: 6.8 10*3/uL (ref 3.4–10.8)

## 2017-03-10 LAB — LIPID PANEL
CHOL/HDL RATIO: 2.9 ratio (ref 0.0–4.4)
CHOLESTEROL TOTAL: 147 mg/dL (ref 100–199)
HDL: 50 mg/dL (ref >39)
LDL CALC: 69 mg/dL (ref 0–99)
Triglycerides: 138 mg/dL (ref 0–149)
VLDL CHOLESTEROL CAL: 28 mg/dL (ref 5–40)

## 2017-03-10 NOTE — Patient Instructions (Addendum)
Rhonda Burns , Thank you for taking time to come for your Medicare Wellness Visit. I appreciate your ongoing commitment to your health goals. Please review the following plan we discussed and let me know if I can assist you in the future.   Screening recommendations/referrals: Colonoscopy: up to date, next due 07/25/2018 Mammogram: up to date, next due 01/13/2019 Bone Density: up to date, next due 03/23/2020 Recommended yearly ophthalmology/optometry visit for glaucoma screening and checkup Recommended yearly dental visit for hygiene and checkup  Vaccinations: Influenza vaccine: declined Pneumococcal vaccine: up to date Tdap vaccine: declined due to insurance Shingles vaccine: up to date    Advanced directives: Please bring a copy of your POA (Power of [place]) and/or Living Will to your next appointment.   Conditions/risks identified: Try to start going to the gym and doing more exercises to work on your core.  Next appointment: 03/14/15 @ 3 pm with Dr.Shaw   Preventive Care 16 Years and Older, Female Preventive care refers to lifestyle choices and visits with your health care provider that can promote health and wellness. What does preventive care include?  A yearly physical exam. This is also called an annual well check.  Dental exams once or twice a year.  Routine eye exams. Ask your health care provider how often you should have your eyes checked.  Personal lifestyle choices, including:  Daily care of your teeth and gums.  Regular physical activity.  Eating a healthy diet.  Avoiding tobacco and drug use.  Limiting alcohol use.  Practicing safe sex.  Taking low-dose aspirin every day.  Taking vitamin and mineral supplements as recommended by your health care provider. What happens during an annual well check? The services and screenings done by your health care provider during your annual well check will depend on your age, overall health, lifestyle risk factors, and  family history of disease. Counseling  Your health care provider may ask you questions about your:  Alcohol use.  Tobacco use.  Drug use.  Emotional well-being.  Home and relationship well-being.  Sexual activity.  Eating habits.  History of falls.  Memory and ability to understand (cognition).  Work and work Statistician.  Reproductive health. Screening  You may have the following tests or measurements:  Height, weight, and BMI.  Blood pressure.  Lipid and cholesterol levels. These may be checked every 5 years, or more frequently if you are over 81 years old.  Skin check.  Lung cancer screening. You may have this screening every year starting at age 28 if you have a 30-pack-year history of smoking and currently smoke or have quit within the past 15 years.  Fecal occult blood test (FOBT) of the stool. You may have this test every year starting at age 80.  Flexible sigmoidoscopy or colonoscopy. You may have a sigmoidoscopy every 5 years or a colonoscopy every 10 years starting at age 49.  Hepatitis C blood test.  Hepatitis B blood test.  Sexually transmitted disease (STD) testing.  Diabetes screening. This is done by checking your blood sugar (glucose) after you have not eaten for a while (fasting). You may have this done every 1-3 years.  Bone density scan. This is done to screen for osteoporosis. You may have this done starting at age 5.  Mammogram. This may be done every 1-2 years. Talk to your health care provider about how often you should have regular mammograms. Talk with your health care provider about your test results, treatment options, and if necessary, the  need for more tests. Vaccines  Your health care provider may recommend certain vaccines, such as:  Influenza vaccine. This is recommended every year.  Tetanus, diphtheria, and acellular pertussis (Tdap, Td) vaccine. You may need a Td booster every 10 years.  Zoster vaccine. You may need this  after age 67.  Pneumococcal 13-valent conjugate (PCV13) vaccine. One dose is recommended after age 39.  Pneumococcal polysaccharide (PPSV23) vaccine. One dose is recommended after age 2. Talk to your health care provider about which screenings and vaccines you need and how often you need them. This information is not intended to replace advice given to you by your health care provider. Make sure you discuss any questions you have with your health care provider. Document Released: 05/08/2015 Document Revised: 12/30/2015 Document Reviewed: 02/10/2015 Elsevier Interactive Patient Education  2017 [place] Prevention in the Home Falls can cause injuries. They can happen to people of all ages. There are many things you can do to make your home safe and to help prevent falls. What can I do on the outside of my home?  Regularly fix the edges of walkways and driveways and fix any cracks.  Remove anything that might make you trip as you walk through a door, such as a raised step or threshold.  Trim any bushes or trees on the path to your home.  Use bright outdoor lighting.  Clear any walking paths of anything that might make someone trip, such as rocks or tools.  Regularly check to see if handrails are loose or broken. Make sure that both sides of any steps have handrails.  Any raised decks and porches should have guardrails on the edges.  Have any leaves, snow, or ice cleared regularly.  Use sand or salt on walking paths during winter.  Clean up any spills in your garage right away. This includes oil or grease spills. What can I do in the bathroom?  Use night lights.  Install grab bars by the toilet and in the tub and shower. Do not use towel bars as grab bars.  Use non-skid mats or decals in the tub or shower.  If you need to sit down in the shower, use a plastic, non-slip stool.  Keep the floor dry. Clean up any water that spills on the floor as soon as it  happens.  Remove soap buildup in the tub or shower regularly.  Attach bath mats securely with double-sided non-slip rug tape.  Do not have throw rugs and other things on the floor that can make you trip. What can I do in the bedroom?  Use night lights.  Make sure that you have a light by your bed that is easy to reach.  Do not use any sheets or blankets that are too big for your bed. They should not hang down onto the floor.  Have a firm chair that has side arms. You can use this for support while you get dressed.  Do not have throw rugs and other things on the floor that can make you trip. What can I do in the kitchen?  Clean up any spills right away.  Avoid walking on wet floors.  Keep items that you use a lot in easy-to-reach places.  If you need to reach something above you, use a strong step stool that has a grab bar.  Keep electrical cords out of the way.  Do not use floor polish or wax that makes floors slippery. If you must use wax,  use non-skid floor wax.  Do not have throw rugs and other things on the floor that can make you trip. What can I do with my stairs?  Do not leave any items on the stairs.  Make sure that there are handrails on both sides of the stairs and use them. Fix handrails that are broken or loose. Make sure that handrails are as long as the stairways.  Check any carpeting to make sure that it is firmly attached to the stairs. Fix any carpet that is loose or worn.  Avoid having throw rugs at the top or bottom of the stairs. If you do have throw rugs, attach them to the floor with carpet tape.  Make sure that you have a light switch at the top of the stairs and the bottom of the stairs. If you do not have them, ask someone to add them for you. What else can I do to help prevent falls?  Wear shoes that:  Do not have high heels.  Have rubber bottoms.  Are comfortable and fit you well.  Are closed at the toe. Do not wear sandals.  If you  use a stepladder:  Make sure that it is fully opened. Do not climb a closed stepladder.  Make sure that both sides of the stepladder are locked into place.  Ask someone to hold it for you, if possible.  Clearly mark and make sure that you can see:  Any grab bars or handrails.  First and last steps.  Where the edge of each step is.  Use tools that help you move around (mobility aids) if they are needed. These include:  Canes.  Walkers.  Scooters.  Crutches.  Turn on the lights when you go into a dark area. Replace any light bulbs as soon as they burn out.  Set up your furniture so you have a clear path. Avoid moving your furniture around.  If any of your floors are uneven, fix them.  If there are any pets around you, be aware of where they are.  Review your medicines with your doctor. Some medicines can make you feel dizzy. This can increase your chance of falling. Ask your doctor what other things that you can do to help prevent falls. This information is not intended to replace advice given to you by your health care provider. Make sure you discuss any questions you have with your health care provider. Document Released: 02/05/2009 Document Revised: 09/17/2015 Document Reviewed: 05/16/2014 Elsevier Interactive Patient Education  2017 [employer].

## 2017-03-10 NOTE — Progress Notes (Signed)
Subjective:   Rhonda Burns is a 71 y.o. female who presents for Medicare Annual (Subsequent) preventive examination.  Review of Systems:  N/A Cardiac Risk Factors include: advanced age (>27men, >9 women);hypertension;dyslipidemia     Objective:     Vitals: BP 130/72   Pulse 86   Ht 5\' 1"  (1.549 m)   Wt 137 lb 2 oz (62.2 kg)   SpO2 97%   BMI 25.91 kg/m   Body mass index is 25.91 kg/m.   Tobacco Social History   Tobacco Use  Smoking Status Former Smoker  Smokeless Tobacco Never Used     Counseling given: Not Answered   Past Medical History:  Diagnosis Date  . Abdominal pain, chronic, right upper quadrant   . Allergic rhinitis, cause unspecified   . Allergy   . Asthma   . Cataract    growing cataracts  . GERD (gastroesophageal reflux disease)   . Hiatal hernia   . Hyperlipidemia   . Hypertension   . Osteoporosis   . Osteoporosis   . Schatzki's ring    Past Surgical History:  Procedure Laterality Date  . CESAREAN SECTION    . COLONOSCOPY     Family History  Problem Relation Age of Onset  . Alzheimer's disease Mother   . Heart disease Mother   . Stroke Mother   . Asthma Sister   . Heart disease Father   . Heart attack Father   . Diabetes Father   . Colon cancer Neg Hx   . Esophageal cancer Neg Hx    Social History   Substance and Sexual Activity  Sexual Activity Not on file    Outpatient Encounter Medications as of 03/10/2017  Medication Sig  . acetaminophen (TYLENOL) 500 MG tablet Take 500 mg by mouth every 8 (eight) hours as needed.  Marland Kitchen albuterol (VENTOLIN HFA) 108 (90 Base) MCG/ACT inhaler Inhale 2 puffs into the lungs every 6 (six) hours as needed for wheezing.  Marland Kitchen aspirin 325 MG tablet Take 325 mg by mouth daily.  . calcium gluconate 500 MG tablet Take 500 mg by mouth daily.  . Cholecalciferol (D3 SUPER STRENGTH) 2000 UNITS CAPS Take by mouth once.  . fluticasone (FLONASE) 50 MCG/ACT nasal spray Place 2 sprays into both nostrils daily as  needed for allergies.  Marland Kitchen lisinopril (PRINIVIL,ZESTRIL) 5 MG tablet Take 1 tablet (5 mg total) by mouth daily.  . meloxicam (MOBIC) 15 MG tablet Take 1 tablet (15 mg total) by mouth daily. After prednisone is complete  . pravastatin (PRAVACHOL) 40 MG tablet TAKE 1 TABLET DAILY.  Marland Kitchen predniSONE (DELTASONE) 10 MG tablet 4 tabs po qam x 2d, then 2 tabs po qam x 2d, then 1 tab po qam x 2d  . triamcinolone cream (KENALOG) 0.1 % Apply 1 application topically 2 (two) times daily.   No facility-administered encounter medications on file as of 03/10/2017.     Activities of Daily Living In your present state of health, do you have any difficulty performing the following activities: 03/10/2017 03/10/2016  Hearing? N N  Vision? Y N  Comment Patient has trouble seeing at nightitme and does not drive at night -  Difficulty concentrating or making decisions? N N  Walking or climbing stairs? N N  Dressing or bathing? N N  Doing errands, shopping? N N  Preparing Food and eating ? N -  Using the Toilet? N -  In the past six months, have you accidently leaked urine? N -  Do you have  problems with loss of bowel control? N -  Managing your Medications? N -  Managing your Finances? N -  Housekeeping or managing your Housekeeping? N -  Some recent data might be hidden    Patient Care Team: Shawnee Knapp, MD as PCP - General (Family Medicine) Warden Fillers, MD as Consulting Physician (Ophthalmology) Gardiner Barefoot, DPM as Consulting Physician (Podiatry)    Assessment:     Exercise Activities and Dietary recommendations Current Exercise Habits: Home exercise routine, Type of exercise: walking, Time (Minutes): > 60(120 min per day), Frequency (Times/Week): 7, Weekly Exercise (Minutes/Week): 0, Intensity: Mild, Exercise limited by: None identified  Goals    . Exercise 3x per week (30 min per time)     Patient states that she wants to try to start going to the gym and doing more exercises to work on  her core.       Fall Risk Fall Risk  03/10/2017 02/22/2017 03/10/2016 05/27/2015 03/11/2015  Falls in the past year? No No No No No   Depression Screen PHQ 2/9 Scores 03/10/2017 02/22/2017 03/10/2016 05/27/2015  PHQ - 2 Score 0 0 0 0     Cognitive Function     6CIT Screen 03/10/2017  What Year? 0 points  What month? 0 points  What time? 0 points  Count back from 20 0 points  Months in reverse 0 points  Repeat phrase 4 points  Total Score 4    Immunization History  Administered Date(s) Administered  . PPD Test 02/22/2005  . Pneumococcal Conjugate-13 03/11/2015  . Pneumococcal Polysaccharide-23 03/10/2009, 03/11/2014  . Td 08/21/1996, 07/04/2006  . Zoster 09/28/2012   Screening Tests Health Maintenance  Topic Date Due  . INFLUENZA VACCINE  07/23/2017 (Originally 11/23/2016)  . TETANUS/TDAP  03/10/2018 (Originally 07/03/2016)  . COLONOSCOPY  07/25/2018  . MAMMOGRAM  01/13/2019  . DEXA SCAN  Completed  . Hepatitis C Screening  Completed      Plan:   I have personally reviewed and noted the following in the patient's chart:   . Medical and social history . Use of alcohol, tobacco or illicit drugs  . Current medications and supplements . Functional ability and status . Nutritional status . Physical activity . Advanced directives . List of other physicians . Hospitalizations, surgeries, and ER visits in previous 12 months . Vitals . Screenings to include cognitive, depression, and falls . Referrals and appointments  In addition, I have reviewed and discussed with patient certain preventive protocols, quality metrics, and best practice recommendations. A written personalized care plan for preventive services as well as general preventive health recommendations were provided to patient.    Patient declined flu vaccine.   1. Hyperlipidemia, unspecified hyperlipidemia type - Lipid panel  2. Essential hypertension - Comprehensive metabolic panel - CBC with  Differential/Platelet   Andrez Grime, LPN  [id number]/09/[id number]

## 2017-03-13 ENCOUNTER — Other Ambulatory Visit: Payer: Self-pay

## 2017-03-13 ENCOUNTER — Ambulatory Visit (INDEPENDENT_AMBULATORY_CARE_PROVIDER_SITE_OTHER): Payer: PPO | Admitting: Family Medicine

## 2017-03-13 ENCOUNTER — Encounter: Payer: Self-pay | Admitting: Family Medicine

## 2017-03-13 VITALS — BP 124/78 | HR 86 | Temp 98.8°F | Resp 16 | Ht 61.0 in | Wt 137.4 lb

## 2017-03-13 DIAGNOSIS — N83201 Unspecified ovarian cyst, right side: Secondary | ICD-10-CM | POA: Diagnosis not present

## 2017-03-13 DIAGNOSIS — E785 Hyperlipidemia, unspecified: Secondary | ICD-10-CM

## 2017-03-13 DIAGNOSIS — Z0000 Encounter for general adult medical examination without abnormal findings: Secondary | ICD-10-CM | POA: Diagnosis not present

## 2017-03-13 DIAGNOSIS — H6123 Impacted cerumen, bilateral: Secondary | ICD-10-CM | POA: Diagnosis not present

## 2017-03-13 DIAGNOSIS — I10 Essential (primary) hypertension: Secondary | ICD-10-CM | POA: Diagnosis not present

## 2017-03-13 MED ORDER — PRAVASTATIN SODIUM 40 MG PO TABS: ORAL_TABLET | ORAL | 3 refills | Status: DC

## 2017-03-13 MED ORDER — LISINOPRIL 5 MG PO TABS: 5.0000 mg | ORAL_TABLET | Freq: Every day | ORAL | 3 refills | Status: DC

## 2017-03-13 NOTE — Patient Instructions (Signed)
Health Maintenance for Postmenopausal Women Menopause is a normal process in which your reproductive ability comes to an end. This process happens gradually over a span of months to years, usually between the ages of 22 and 9. Menopause is complete when you have missed 12 consecutive menstrual periods. It is important to talk with your health care provider about some of the most common conditions that affect postmenopausal women, such as heart disease, cancer, and bone loss (osteoporosis). Adopting a healthy lifestyle and getting preventive care can help to promote your health and wellness. Those actions can also lower your chances of developing some of these common conditions. What should I know about menopause? During menopause, you may experience a number of symptoms, such as:  Moderate-to-severe hot flashes.  Night sweats.  Decrease in sex drive.  Mood swings.  Headaches.  Tiredness.  Irritability.  Memory problems.  Insomnia.  Choosing to treat or not to treat menopausal changes is an individual decision that you make with your health care provider. What should I know about hormone replacement therapy and supplements? Hormone therapy products are effective for treating symptoms that are associated with menopause, such as hot flashes and night sweats. Hormone replacement carries certain risks, especially as you become older. If you are thinking about using estrogen or estrogen with progestin treatments, discuss the benefits and risks with your health care provider. What should I know about heart disease and stroke? Heart disease, heart attack, and stroke become more likely as you age. This may be due, in part, to the hormonal changes that your body experiences during menopause. These can affect how your body processes dietary fats, triglycerides, and cholesterol. Heart attack and stroke are both medical emergencies. There are many things that you can do to help prevent heart disease  and stroke:  Have your blood pressure checked at least every 1-2 years. High blood pressure causes heart disease and increases the risk of stroke.  If you are 53-22 years old, ask your health care provider if you should take aspirin to prevent a heart attack or a stroke.  Do not use any tobacco products, including cigarettes, chewing tobacco, or electronic cigarettes. If you need help quitting, ask your health care provider.  It is important to eat a healthy diet and maintain a healthy weight. ? Be sure to include plenty of vegetables, fruits, low-fat dairy products, and lean protein. ? Avoid eating foods that are high in solid fats, added sugars, or salt (sodium).  Get regular exercise. This is one of the most important things that you can do for your health. ? Try to exercise for at least 150 minutes each week. The type of exercise that you do should increase your heart rate and make you sweat. This is known as moderate-intensity exercise. ? Try to do strengthening exercises at least twice each week. Do these in addition to the moderate-intensity exercise.  Know your numbers.Ask your health care provider to check your cholesterol and your blood glucose. Continue to have your blood tested as directed by your health care provider.  What should I know about cancer screening? There are several types of cancer. Take the following steps to reduce your risk and to catch any cancer development as early as possible. Breast Cancer  Practice breast self-awareness. ? This means understanding how your breasts normally appear and feel. ? It also means doing regular breast self-exams. Let your health care provider know about any changes, no matter how small.  If you are 40  or older, have a clinician do a breast exam (clinical breast exam or CBE) every year. Depending on your age, family history, and medical history, it may be recommended that you also have a yearly breast X-ray (mammogram).  If you  have a family history of breast cancer, talk with your health care provider about genetic screening.  If you are at high risk for breast cancer, talk with your health care provider about having an MRI and a mammogram every year.  Breast cancer (BRCA) gene test is recommended for women who have family members with BRCA-related cancers. Results of the assessment will determine the need for genetic counseling and BRCA1 and for BRCA2 testing. BRCA-related cancers include these types: ? Breast. This occurs in males or females. ? Ovarian. ? Tubal. This may also be called fallopian tube cancer. ? Cancer of the abdominal or pelvic lining (peritoneal cancer). ? Prostate. ? Pancreatic.  Cervical, Uterine, and Ovarian Cancer Your health care provider may recommend that you be screened regularly for cancer of the pelvic organs. These include your ovaries, uterus, and vagina. This screening involves a pelvic exam, which includes checking for microscopic changes to the surface of your cervix (Pap test).  For women ages 21-65, health care providers may recommend a pelvic exam and a Pap test every three years. For women ages 79-65, they may recommend the Pap test and pelvic exam, combined with testing for human papilloma virus (HPV), every five years. Some types of HPV increase your risk of cervical cancer. Testing for HPV may also be done on women of any age who have unclear Pap test results.  Other health care providers may not recommend any screening for nonpregnant women who are considered low risk for pelvic cancer and have no symptoms. Ask your health care provider if a screening pelvic exam is right for you.  If you have had past treatment for cervical cancer or a condition that could lead to cancer, you need Pap tests and screening for cancer for at least 20 years after your treatment. If Pap tests have been discontinued for you, your risk factors (such as having a new sexual partner) need to be  reassessed to determine if you should start having screenings again. Some women have medical problems that increase the chance of getting cervical cancer. In these cases, your health care provider may recommend that you have screening and Pap tests more often.  If you have a family history of uterine cancer or ovarian cancer, talk with your health care provider about genetic screening.  If you have vaginal bleeding after reaching menopause, tell your health care provider.  There are currently no reliable tests available to screen for ovarian cancer.  Lung Cancer Lung cancer screening is recommended for adults 69-62 years old who are at high risk for lung cancer because of a history of smoking. A yearly low-dose CT scan of the lungs is recommended if you:  Currently smoke.  Have a history of at least 30 pack-years of smoking and you currently smoke or have quit within the past 15 years. A pack-year is smoking an average of one pack of cigarettes per day for one year.  Yearly screening should:  Continue until it has been 15 years since you quit.  Stop if you develop a health problem that would prevent you from having lung cancer treatment.  Colorectal Cancer  This type of cancer can be detected and can often be prevented.  Routine colorectal cancer screening usually begins at  age 42 and continues through age 45.  If you have risk factors for colon cancer, your health care provider may recommend that you be screened at an earlier age.  If you have a family history of colorectal cancer, talk with your health care provider about genetic screening.  Your health care provider may also recommend using home test kits to check for hidden blood in your stool.  A small camera at the end of a tube can be used to examine your colon directly (sigmoidoscopy or colonoscopy). This is done to check for the earliest forms of colorectal cancer.  Direct examination of the colon should be repeated every  5-10 years until age 71. However, if early forms of precancerous polyps or small growths are found or if you have a family history or genetic risk for colorectal cancer, you may need to be screened more often.  Skin Cancer  Check your skin from head to toe regularly.  Monitor any moles. Be sure to tell your health care provider: ? About any new moles or changes in moles, especially if there is a change in a mole's shape or color. ? If you have a mole that is larger than the size of a pencil eraser.  If any of your family members has a history of skin cancer, especially at a young age, talk with your health care provider about genetic screening.  Always use sunscreen. Apply sunscreen liberally and repeatedly throughout the day.  Whenever you are outside, protect yourself by wearing long sleeves, pants, a wide-brimmed hat, and sunglasses.  What should I know about osteoporosis? Osteoporosis is a condition in which bone destruction happens more quickly than new bone creation. After menopause, you may be at an increased risk for osteoporosis. To help prevent osteoporosis or the bone fractures that can happen because of osteoporosis, the following is recommended:  If you are 46-71 years old, get at least 1,000 mg of calcium and at least 600 mg of vitamin D per day.  If you are older than age 55 but younger than age 65, get at least 1,200 mg of calcium and at least 600 mg of vitamin D per day.  If you are older than age 54, get at least 1,200 mg of calcium and at least 800 mg of vitamin D per day.  Smoking and excessive alcohol intake increase the risk of osteoporosis. Eat foods that are rich in calcium and vitamin D, and do weight-bearing exercises several times each week as directed by your health care provider. What should I know about how menopause affects my mental health? Depression may occur at any age, but it is more common as you become older. Common symptoms of depression  include:  Low or sad mood.  Changes in sleep patterns.  Changes in appetite or eating patterns.  Feeling an overall lack of motivation or enjoyment of activities that you previously enjoyed.  Frequent crying spells.  Talk with your health care provider if you think that you are experiencing depression. What should I know about immunizations? It is important that you get and maintain your immunizations. These include:  Tetanus, diphtheria, and pertussis (Tdap) booster vaccine.  Influenza every year before the flu season begins.  Pneumonia vaccine.  Shingles vaccine.  Your health care provider may also recommend other immunizations. This information is not intended to replace advice given to you by your health care provider. Make sure you discuss any questions you have with your health care provider. Document Released: 06/03/2005  Document Revised: 10/30/2015 Document Reviewed: 01/13/2015 Elsevier Interactive Patient Education  2018 [employer]  

## 2017-03-13 NOTE — Progress Notes (Signed)
Subjective:    Patient ID: Rhonda Burns, female    DOB: 21-Oct-1945, 71 y.o.   MRN: [id number] Chief Complaint  Patient presents with  . Annual Exam    HPI  [name] is a delightful 71 yo woman here today for her complete physical.   She had her Medicare annual wellness visit done with [facility] 3d prior.  Primary Preventative Screenings: Cervical Cancer: NA due to age. STI screening: neg hep C 2014 Breast Cancer: Mammogram 12/2016 nml. (at [facility] suspect?) done annually.  Colorectal Cancer: Dr. Havery Moros colonoscopy 07/24/2008 - "normal" - repeat in 10 yrs Tobacco use/EtOH/substances: Bone Density: DEXA 03/24/2015 NORMAL (T score -0.8 at Left femur neck) Cardiac: Exercise tolerance test showed some ST depression so advised Stress myoview 09/17/2013 normal - no evidence of ischemia, LV EF 70% - and echo (nml LVF with grade 1 diastolic dysfxn, mild AR) - Dr. Fransico Him 08/2013 Weight/Blood sugar/Diet/Exercise:  BMI Readings from Last 3 Encounters:  03/10/17 25.91 kg/m  02/22/17 26.43 kg/m  03/10/16 27.17 kg/m   OTC/Vit/Supp/Herbal: ca 500 w/ vit D 2000u/d. Starting mvi gummy. Asa qd. Prn tylenol. Fish oil qd.  Dentist/Optho: Dr. Katy Fitch ophtho - seen in Sept and dentist 2x/wk. Immunizations:  Immunization History  Administered Date(s) Administered  . PPD Test 02/22/2005  . Pneumococcal Conjugate-13 03/11/2015  . Pneumococcal Polysaccharide-23 03/10/2009, 03/11/2014  . Td 08/21/1996, 07/04/2006  . Zoster 09/28/2012     Chronic Medical Conditions: 1. HPL: on pravastatin 40mg . 2. HTN: On lisinopril 5. Very variable with occasional BP spikes so pt would like to stay on. Does have BP cuff but does not check at home.  3. Osteoporosis- H/o: now off fosamax for 6-7 years.  06/3011 vit D nml at 69 4. Chronic abdominal pain: Dr. Havery Moros reported last yr after upper and lower endoscopy that: "gastric biopsies were negative, no evidence of H pylori. Her MRCP and EGD have not yielded a  cause for her pain, which I suspect is due to a musculoskeletal etiology. If her pain persists please book her a follow up clinic appointment so we can discuss potential treatment options moving forward."  Past Medical History:  Diagnosis Date  . Abdominal pain, chronic, right upper quadrant   . Allergic rhinitis, cause unspecified   . Allergy   . Asthma   . Cataract    growing cataracts  . GERD (gastroesophageal reflux disease)   . Hiatal hernia   . Hyperlipidemia   . Hypertension   . Osteoporosis   . Osteoporosis   . Schatzki's ring    Past Surgical History:  Procedure Laterality Date  . CESAREAN SECTION    . COLONOSCOPY     Current Outpatient Medications on File Prior to Visit  Medication Sig Dispense Refill  . acetaminophen (TYLENOL) 500 MG tablet Take 500 mg by mouth every 8 (eight) hours as needed.    Marland Kitchen albuterol (VENTOLIN HFA) 108 (90 Base) MCG/ACT inhaler Inhale 2 puffs into the lungs every 6 (six) hours as needed for wheezing. 3 Inhaler 0  . aspirin 325 MG tablet Take 325 mg by mouth daily.    . calcium gluconate 500 MG tablet Take 500 mg by mouth daily.    . Cholecalciferol (D3 SUPER STRENGTH) 2000 UNITS CAPS Take by mouth once.    . fluticasone (FLONASE) 50 MCG/ACT nasal spray Place 2 sprays into both nostrils daily as needed for allergies. 16 g 5  . meloxicam (MOBIC) 15 MG tablet Take 1 tablet (15 mg total) by  mouth daily. After prednisone is complete 30 tablet 0  . triamcinolone cream (KENALOG) 0.1 % Apply 1 application topically 2 (two) times daily. 80 g 1   No current facility-administered medications on file prior to visit.    Allergies  Allergen Reactions  . Other Swelling    Pecans and coconut   Family History  Problem Relation Age of Onset  . Alzheimer's disease Mother   . Heart disease Mother   . Stroke Mother   . Asthma Sister   . Heart disease Father   . Heart attack Father   . Diabetes Father   . Colon cancer Neg Hx   . Esophageal cancer Neg  Hx    Social History   Socioeconomic History  . Marital status: Widowed    Spouse name: None  . Number of children: 1  . Years of education: 68  . Highest education level: Some college, no degree  Social Needs  . Financial resource strain: Not hard at all  . Food insecurity - worry: Never true  . Food insecurity - inability: Never true  . Transportation needs - medical: No  . Transportation needs - non-medical: No  Occupational History  . Occupation: retired  Tobacco Use  . Smoking status: Former Research scientist (life sciences)  . Smokeless tobacco: Never Used  Substance and Sexual Activity  . Alcohol use: No    Alcohol/week: 0.0 oz  . Drug use: No  . Sexual activity: None  Other Topics Concern  . None  Social History Narrative   Exercise walking 7 days per week for 1 hour   Depression screen [facility] 2/9 03/10/2017 02/22/2017 03/10/2016 05/27/2015 03/11/2015  Decreased Interest 0 0 0 0 0  Down, Depressed, Hopeless 0 0 0 0 0  PHQ - 2 Score 0 0 0 0 0    Review of Systems  Eyes: Positive for visual disturbance.  Gastrointestinal: Positive for abdominal pain.  Genitourinary: Positive for frequency.  Musculoskeletal: Positive for myalgias and neck pain.  Allergic/Immunologic: Positive for environmental allergies and food allergies.  Neurological: Positive for light-headedness.  All other systems reviewed and are negative.  See hpi    Objective:   Physical Exam  Constitutional: She is oriented to person, place, and time. She appears well-developed and well-nourished. No distress.  HENT:  Head: Normocephalic and atraumatic.  Right Ear: Tympanic membrane, external ear and ear canal normal.  Left Ear: Tympanic membrane, external ear and ear canal normal.  Nose: Nose normal. No mucosal edema or rhinorrhea.  Mouth/Throat: Uvula is midline, oropharynx is clear and moist and mucous membranes are normal. No posterior oropharyngeal erythema.  Eyes: Conjunctivae and EOM are normal. Pupils are equal, round,  and reactive to light. Right eye exhibits no discharge. Left eye exhibits no discharge. No scleral icterus.  Neck: Normal range of motion. Neck supple. No thyromegaly present.  Cardiovascular: Normal rate, regular rhythm, normal heart sounds and intact distal pulses.  Pulmonary/Chest: Effort normal and breath sounds normal. No respiratory distress.  Abdominal: Soft. Bowel sounds are normal. There is no tenderness.  Genitourinary: No breast swelling, tenderness, discharge or bleeding.  Musculoskeletal: She exhibits no edema.  Lymphadenopathy:    She has no cervical adenopathy.    She has no axillary adenopathy.       Right: No supraclavicular adenopathy present.       Left: No supraclavicular adenopathy present.  Neurological: She is alert and oriented to person, place, and time. She has normal reflexes.  Skin: Skin is warm and dry.  She is not diaphoretic. No erythema.  Psychiatric: She has a normal mood and affect. Her behavior is normal.      BP 124/78   Pulse 86   Temp 98.8 F (37.1 C)   Resp 16   Ht 5\' 1"  (1.549 m)   Wt 137 lb 6.4 oz (62.3 kg)   SpO2 96%   BMI 25.96 kg/m      Assessment & Plan:  Review pelvic US and lab results w/ pt. Flu shot - pt refuses as she has got the flu after Due for tetanus if injury Due for shingrix - has been trying to get but everyone is out. On the waiting list on [facility] Consider [id number]? Order DEXA and mammomgram to [facility] 12/2017.   Repeat pelvic US in 1 yr to assess ovarian cyst. Consider CA-125?? \ 1. Annual physical exam   2. Cyst of right ovary   3. Bilateral impacted cerumen   4. Hyperlipidemia, unspecified hyperlipidemia type   5. Essential hypertension     Orders Placed This Encounter  Procedures  . CA 125  . Ear wax removal    Meds ordered this encounter  Medications  . pravastatin (PRAVACHOL) 40 MG tablet    Sig: TAKE 1 TABLET DAILY.    Dispense:  90 tablet    Refill:  3  . lisinopril (PRINIVIL,ZESTRIL) 5 MG  tablet    Sig: Take 1 tablet (5 mg total) daily by mouth.    Dispense:  90 tablet    Refill:  3     Delman Cheadle, M.D.  Primary Care at [facility] [address] [place], [place] [address] [phone] phone [phone] fax  03/16/17 9:34 AM

## 2017-03-15 LAB — CA 125: Cancer Antigen (CA) 125: 6.8 U/mL (ref 0.0–38.1)

## 2017-03-15 LAB — SPECIMEN STATUS REPORT

## 2017-08-24 ENCOUNTER — Encounter: Payer: Self-pay | Admitting: Family Medicine

## 2017-08-24 ENCOUNTER — Other Ambulatory Visit: Payer: Self-pay

## 2017-08-24 ENCOUNTER — Ambulatory Visit (INDEPENDENT_AMBULATORY_CARE_PROVIDER_SITE_OTHER): Payer: PPO | Admitting: Family Medicine

## 2017-08-24 VITALS — BP 164/70 | HR 78 | Temp 98.6°F | Resp 18 | Ht 61.0 in | Wt 137.8 lb

## 2017-08-24 DIAGNOSIS — G8929 Other chronic pain: Secondary | ICD-10-CM | POA: Diagnosis not present

## 2017-08-24 DIAGNOSIS — R1031 Right lower quadrant pain: Secondary | ICD-10-CM | POA: Diagnosis not present

## 2017-08-24 DIAGNOSIS — N83201 Unspecified ovarian cyst, right side: Secondary | ICD-10-CM

## 2017-08-24 DIAGNOSIS — R102 Pelvic and perineal pain: Secondary | ICD-10-CM

## 2017-08-24 DIAGNOSIS — L723 Sebaceous cyst: Secondary | ICD-10-CM | POA: Diagnosis not present

## 2017-08-24 DIAGNOSIS — L089 Local infection of the skin and subcutaneous tissue, unspecified: Secondary | ICD-10-CM | POA: Diagnosis not present

## 2017-08-24 LAB — POCT URINALYSIS DIP (MANUAL ENTRY)
BILIRUBIN UA: NEGATIVE
Blood, UA: NEGATIVE
GLUCOSE UA: NEGATIVE mg/dL
LEUKOCYTES UA: NEGATIVE
NITRITE UA: NEGATIVE
Protein Ur, POC: NEGATIVE mg/dL
Spec Grav, UA: >=1.03 — AB (ref 1.010–1.025)
Urobilinogen, UA: 0.2 E.U./dL
pH, UA: 5 (ref 5.0–8.0)

## 2017-08-24 LAB — POC MICROSCOPIC URINALYSIS (UMFC): Mucus: ABSENT

## 2017-08-24 MED ORDER — DOXYCYCLINE HYCLATE 100 MG PO TABS: 100.0000 mg | ORAL_TABLET | Freq: Two times a day (BID) | ORAL | 0 refills | Status: DC

## 2017-08-24 MED ORDER — FLUTICASONE PROPIONATE 50 MCG/ACT NA SUSP: 2.0000 | Freq: Every day | NASAL | 5 refills | Status: DC | PRN

## 2017-08-24 NOTE — Progress Notes (Signed)
Per provider instruction, mupirocin 2% applied to site of wound, covered with non-adhesive telfa dressing and tegaderm over top.

## 2017-08-24 NOTE — Progress Notes (Signed)
Subjective:  By signing my name below, I, Rhonda Burns, attest that this documentation has been prepared under the direction and in the presence of Delman Cheadle, MD. Electronically Signed: Moises Burns, Peak. 08/24/2017 , 2:45 PM .  Patient was seen in Room 1 .   Patient ID: Rhonda Burns, female    DOB: [birth year], 72 y.o.   MRN: [id number] Chief Complaint  Patient presents with  . Abdominal Pain    Pt states pain is same from the last visit. Pt states she doesn't want to waitl Nov to discuss.  . Follow-up  . Cyst    x2 week, lower left back. Pt states she has not had any discharge yet.   HPI Rhonda Burns is a 72 y.o. female who presents to Primary Care at [facility] for follow up. Patient has a long standing history of chronic RUQ abdominal pain, seen by Dr. Havery Moros. She had an endoscopy done with normal MRCP, suspected musculoskeletal etiology; advised if pain persists, to return for another visit. However, she was last examined 6 months ago, and pain actually seem to locate more RLQ rather than RUQ, so she was sent for pelvic US. Xray showed no abnormal area right ASIS but possible Paget's disease in right iliac bone. US pelvic showed 1.8cm right ovarian cyst, recommended pelvic US repeat in 12 months; normal and no adnexal mass of left ovary.   She believes the cyst has grown. She describes feeling a slight pain over right adnexal / inguinal area. She denies night sweats, unexplained weight loss, loss in appetite or bowel symptoms.   Patient also notes having a cyst over her left lower back noticed 2 weeks ago. She describes area somewhat tender. She denies any discharge from the area yet. She states having cysts usually reappear over this area. She denies checking her BP outside of the office.    Past Medical History:  Diagnosis Date  . Abdominal pain, chronic, right upper quadrant   . Allergic rhinitis, cause unspecified   . Allergy   . Asthma   . Cataract    growing cataracts  .  GERD (gastroesophageal reflux disease)   . Hiatal hernia   . Hyperlipidemia   . Hypertension   . Osteoporosis   . Osteoporosis   . Schatzki's ring    Past Surgical History:  Procedure Laterality Date  . CESAREAN SECTION    . COLONOSCOPY     Prior to Admission medications   Medication Sig Start Date End Date Taking? Authorizing Provider  acetaminophen (TYLENOL) 500 MG tablet Take 500 mg by mouth every 8 (eight) hours as needed.    [provider]  albuterol (VENTOLIN HFA) 108 (90 Base) MCG/ACT inhaler Inhale 2 puffs into the lungs every 6 (six) hours as needed for wheezing. 09/09/16 07/20/18  Shawnee Knapp, MD  aspirin 325 MG tablet Take 325 mg by mouth daily.    [provider]  calcium gluconate 500 MG tablet Take 500 mg by mouth daily.    [provider]  Cholecalciferol (D3 SUPER STRENGTH) 2000 UNITS CAPS Take by mouth once.    [provider]  fluticasone (FLONASE) 50 MCG/ACT nasal spray Place 2 sprays into both nostrils daily as needed for allergies. 03/10/16   Shawnee Knapp, MD  lisinopril (PRINIVIL,ZESTRIL) 5 MG tablet Take 1 tablet (5 mg total) daily by mouth. 03/13/17   Shawnee Knapp, MD  meloxicam (MOBIC) 15 MG tablet Take 1 tablet (15 mg total) by mouth daily. After  prednisone is complete 02/22/17   Shawnee Knapp, MD  pravastatin (PRAVACHOL) 40 MG tablet TAKE 1 TABLET DAILY. 03/13/17   Shawnee Knapp, MD  triamcinolone cream (KENALOG) 0.1 % Apply 1 application topically 2 (two) times daily. 03/10/16   Shawnee Knapp, MD   Allergies  Allergen Reactions  . Other Swelling    Pecans and coconut   Family History  Problem Relation Age of Onset  . Alzheimer's disease Mother   . Heart disease Mother   . Stroke Mother   . Asthma Sister   . Heart disease Father   . Heart attack Father   . Diabetes Father   . Colon cancer Neg Hx   . Esophageal cancer Neg Hx    Social History   Socioeconomic History  . Marital status: Widowed    Spouse name: Not on  file  . Number of children: 1  . Years of education: 50  . Highest education level: Some college, no degree  Occupational History  . Occupation: retired  Scientific laboratory technician  . Financial resource strain: Not hard at all  . Food insecurity:    Worry: Never true    Inability: Never true  . Transportation needs:    Medical: No    Non-medical: No  Tobacco Use  . Smoking status: Former Research scientist (life sciences)  . Smokeless tobacco: Never Used  Substance and Sexual Activity  . Alcohol use: No    Alcohol/week: 0.0 oz  . Drug use: No  . Sexual activity: Not on file  Lifestyle  . Physical activity:    Days per week: 7 days    Minutes per session: 120 min  . Stress: Not at all  Relationships  . Social connections:    Talks on phone: More than three times a week    Gets together: More than three times a week    Attends religious service: More than 4 times per year    Active member of club or organization: Yes    Attends meetings of clubs or organizations: More than 4 times per year    Relationship status: Widowed  Other Topics Concern  . Not on file  Social History Narrative   Exercise walking 7 days per week for 1 hour   Depression screen [facility] 2/9 08/24/2017 03/10/2017 02/22/2017 03/10/2016 05/27/2015  Decreased Interest 0 0 0 0 0  Down, Depressed, Hopeless 0 0 0 0 0  PHQ - 2 Score 0 0 0 0 0    Review of Systems  Constitutional: Negative for appetite change, chills, fatigue, fever and unexpected weight change.  Respiratory: Negative for cough.   Gastrointestinal: Negative for constipation, diarrhea, nausea and vomiting.  Genitourinary: Positive for pelvic pain.  Skin: Negative for rash and wound.       Cyst over left lower back  Neurological: Negative for dizziness, weakness and headaches.       Objective:   Physical Exam  Constitutional: She is oriented to person, place, and time. She appears well-developed and well-nourished. No distress.  HENT:  Head: Normocephalic and atraumatic.  Eyes:  Pupils are equal, round, and reactive to light. EOM are normal.  Neck: Neck supple.  Cardiovascular: Normal rate.  Pulmonary/Chest: Effort normal. No respiratory distress.  Musculoskeletal: Normal range of motion.  Neurological: She is alert and oriented to person, place, and time.  Skin: Skin is warm and dry.  Psychiatric: She has a normal mood and affect. Her behavior is normal.  Nursing note and vitals reviewed.  BP (!) 150/70 (BP Location: Left Arm, Patient Position: Sitting, Cuff Size: Normal)   Pulse 78   Temp 98.6 F (37 C) (Oral)   Resp 18   Ht 5\' 1"  (1.549 m)   Wt 137 lb 12.8 oz (62.5 kg)   SpO2 98%   BMI 26.04 kg/m   Verbal consent contained after review of risks/benefits of procedures.  Area cleaned with betadine x 2.  Anesthesia with approx 7cc of subcutaneous 2% lidocaine with epi with good result.  1/2 cm incision made into center of abscess w/ 11 blade and expressed purulent drainage followed by copious amount of sanguineous drainage. Abscess explored with hemostats and no loculations found. Packed with approx 5 in of iodoform 1/4cm packing gauze and dressed.  Pt tolerated procedure well.     Assessment & Plan:   1. Pelvic pain   2. Infected sebaceous cyst   3. Cyst of right ovary   4. Abdominal pain, chronic, right lower quadrant     Orders Placed This Encounter  Procedures  . Urine Culture  . WOUND CULTURE    Order Specific Question:   Source    Answer:   left lower back sebaceous cyst infected  . US Transvaginal Non-OB    Standing Status:   Future    Number of Occurrences:   1    Standing Expiration Date:   10/25/2018    Order Specific Question:   Reason for Exam (SYMPTOM  OR DIAGNOSIS REQUIRED)    Answer:   f/u Rt ovarian cyst - pt having increasing pain, pressure, fullness    Order Specific Question:   Preferred imaging location?    Answer:   [facility]  . US Pelvis Complete    Standing Status:   Future    Number of Occurrences:   1     Standing Expiration Date:   10/25/2018    Order Specific Question:   Reason for Exam (SYMPTOM  OR DIAGNOSIS REQUIRED)    Answer:   f/u Rt overian cyst - pt having increasing pain, pressure, fullness    Order Specific Question:   Preferred imaging location?    Answer:   [facility]  . POCT urinalysis dipstick  . POCT Microscopic Urinalysis ([facility])    Meds ordered this encounter  Medications  . fluticasone (FLONASE) 50 MCG/ACT nasal spray    Sig: Place 2 sprays into both nostrils daily as needed for allergies.    Dispense:  16 g    Refill:  5  . doxycycline (VIBRA-TABS) 100 MG tablet    Sig: Take 1 tablet (100 mg total) by mouth 2 (two) times daily.    Dispense:  20 tablet    Refill:  0    Delman Cheadle, MD, MPH Primary Care at [place], [place]  [address] [phone] Office phone  [phone] Office fax   01/29/18 7:56 AM

## 2017-08-24 NOTE — Patient Instructions (Addendum)
IF you received an x-ray today, you will receive an invoice from [facility]. Please contact [facility] at [phone] with questions or concerns regarding your invoice.   IF you received labwork today, you will receive an invoice from [place]. Please contact [facility] at [phone] with questions or concerns regarding your invoice.   Our billing staff will not be able to assist you with questions regarding bills from these companies.  You will be contacted with the lab results as soon as they are available. The fastest way to get your results is to activate your My Chart account. Instructions are located on the last page of this paperwork. If you have not heard from [place] regarding the results in 2 weeks, please contact this office.     Incision and Drainage Incision and drainage is a surgical procedure to open and drain a fluid-filled sac. The sac may be filled with pus, mucus, or blood. Examples of fluid-filled sacs that may need surgical drainage include cysts, skin infections (abscesses), and red lumps that develop from a ruptured cyst or a small abscess (boils). You may need this procedure if the affected area is large, painful, infected, or not healing well. Tell a health care provider about:  Any allergies you have.  All medicines you are taking, including vitamins, herbs, eye drops, creams, and over-the-counter medicines.  Any problems you or family members have had with anesthetic medicines.  Any blood disorders you have.  Any surgeries you have had.  Any medical conditions you have.  Whether you are pregnant or may be pregnant. What are the risks? Generally, this is a safe procedure. However, problems may occur, including:  Infection.  Bleeding.  Allergic reactions to medicines.  Scarring.  What happens before the procedure?  You may need an ultrasound or other imaging tests to see how large or deep the fluid-filled sac is.  You may  have blood tests to check for infection.  You may get a tetanus shot.  You may be given antibiotic medicine to help prevent infection.  Follow instructions from your health care provider about eating or drinking restrictions.  Ask your health care provider about: ? Changing or stopping your regular medicines. This is especially important if you are taking diabetes medicines or blood thinners. ? Taking medicines such as aspirin and ibuprofen. These medicines can thin your blood. Do not take these medicines before your procedure if your health care provider instructs you not to.  Plan to have someone take you home after the procedure.  If you will be going home right after the procedure, plan to have someone stay with you for 24 hours. What happens during the procedure?  To reduce your risk of infection: ? Your health care team will wash or sanitize their hands. ? Your skin will be washed with soap.  You will be given one or more of the following: ? A medicine to help you relax (sedative). ? A medicine to numb the area (local anesthetic). ? A medicine to make you fall asleep (general anesthetic).  An incision will be made in the top of the fluid-filled sac.  The contents of the sac may be squeezed out, or a syringe or tube (catheter)may be used to empty the sac.  The catheter may be left in place for several weeks to drain any fluid. Or, your health care provider may stitch open the edges of the incision to make a long-term opening for drainage (marsupialization).  The inside of  the sac may be washed out (irrigated) with a sterile solution and packed with gauze before it is covered with a bandage (dressing). The procedure may vary among health care providers and hospitals. What happens after the procedure?  Your blood pressure, heart rate, breathing rate, and blood oxygen level will be monitored often until the medicines you were given have worn off.  Do not drive for 24 hours if  you received a sedative. This information is not intended to replace advice given to you by your health care provider. Make sure you discuss any questions you have with your health care provider. Document Released: 10/05/2000 Document Revised: 09/17/2015 Document Reviewed: 01/30/2015 Elsevier Interactive Patient Education  2018 [employer] [employer].  Incision and Drainage, Care After Refer to this sheet in the next few weeks. These instructions provide you with information about caring for yourself after your procedure. Your health care provider may also give you more specific instructions. Your treatment has been planned according to current medical practices, but problems sometimes occur. Call your health care provider if you have any problems or questions after your procedure. What can I expect after the procedure? After the procedure, it is common to have:  Pain or discomfort around your incision site.  Drainage from your incision.  Follow these instructions at home:  Take over-the-counter and prescription medicines only as told by your health care provider.  If you were prescribed an antibiotic medicine, take it as told by your health care provider.Do not stop taking the antibiotic even if you start to feel better.  Followinstructions from your health care provider about: ? How to take care of your incision. ? When and how you should change your packing and bandage (dressing). Wash your hands with soap and water before you change your dressing. If soap and water are not available, use hand sanitizer. ? When you should remove your dressing.  Do not take baths, swim, or use a hot tub until your health care provider approves.  Keep all follow-up visits as told by your health care provider. This is important.  Check your incision area every day for signs of infection. Check for: ? More redness, swelling, or pain. ? More fluid or blood. ? Warmth. ? Pus or a bad smell. Contact a health  care provider if:  Your cyst or abscess returns.  You have a fever.  You have more redness, swelling, or pain around your incision.  You have more fluid or blood coming from your incision.  Your incision feels warm to the touch.  You have pus or a bad smell coming from your incision. Get help right away if:  You have severe pain or bleeding.  You cannot eat or drink without vomiting.  You have decreased urine output.  You become short of breath.  You have chest pain.  You cough up blood.  The area where the incision and drainage occurred becomes numb or it tingles. This information is not intended to replace advice given to you by your health care provider. Make sure you discuss any questions you have with your health care provider. Document Released: 07/04/2011 Document Revised: 09/11/2015 Document Reviewed: 01/30/2015 Elsevier Interactive Patient Education  2018 [employer].  Ovarian Cyst An ovarian cyst is a fluid-filled sac that forms on an ovary. The ovaries are small organs that produce eggs in women. Various types of cysts can form on the ovaries. Some may cause symptoms and require treatment. Most ovarian cysts go away on their own, are  not cancerous (are benign), and do not cause problems. Common types of ovarian cysts include:  Functional (follicle) cysts. ? Occur during the menstrual cycle, and usually go away with the next menstrual cycle if you do not get pregnant. ? Usually cause no symptoms.  Endometriomas. ? Are cysts that form from the tissue that lines the uterus (endometrium). ? Are sometimes called "chocolate cysts" because they become filled with blood that turns brown. ? Can cause pain in the lower abdomen during intercourse and during your period.  Cystadenoma cysts. ? Develop from cells on the outside surface of the ovary. ? Can get very large and cause lower abdomen pain and pain with intercourse. ? Can cause severe pain if they twist or  break open (rupture).  Dermoid cysts. ? Are sometimes found in both ovaries. ? May contain different kinds of body tissue, such as skin, teeth, hair, or cartilage. ? Usually do not cause symptoms unless they get very big.  Theca lutein cysts. ? Occur when too much of a certain hormone (human chorionic gonadotropin) is produced and overstimulates the ovaries to produce an egg. ? Are most common after having procedures used to assist with the conception of a baby (in vitro fertilization).  What are the causes? Ovarian cysts may be caused by:  Ovarian hyperstimulation syndrome. This is a condition that can develop from taking fertility medicines. It causes multiple large ovarian cysts to form.  Polycystic ovarian syndrome (PCOS). This is a common hormonal disorder that can cause ovarian cysts, as well as problems with your period or fertility.  What increases the risk? The following factors may make you more likely to develop ovarian cysts:  Being overweight or obese.  Taking fertility medicines.  Taking certain forms of hormonal birth control.  Smoking.  What are the signs or symptoms? Many ovarian cysts do not cause symptoms. If symptoms are present, they may include:  Pelvic pain or pressure.  Pain in the lower abdomen.  Pain during sex.  Abdominal swelling.  Abnormal menstrual periods.  Increasing pain with menstrual periods.  How is this diagnosed? These cysts are commonly found during a routine pelvic exam. You may have tests to find out more about the cyst, such as:  Ultrasound.  X-ray of the pelvis.  CT scan.  MRI.  Blood tests.  How is this treated? Many ovarian cysts go away on their own without treatment. Your health care provider may want to check your cyst regularly for 2-3 months to see if it changes. If you are in menopause, it is especially important to have your cyst monitored closely because menopausal women have a higher rate of ovarian  cancer. When treatment is needed, it may include:  Medicines to help relieve pain.  A procedure to drain the cyst (aspiration).  Surgery to remove the whole cyst.  Hormone treatment or birth control pills. These methods are sometimes used to help dissolve a cyst.  Follow these instructions at home:  Take over-the-counter and prescription medicines only as told by your health care provider.  Do not drive or use heavy machinery while taking prescription pain medicine.  Get regular pelvic exams and Pap tests as often as told by your health care provider.  Return to your normal activities as told by your health care provider. Ask your health care provider what activities are safe for you.  Do not use any products that contain nicotine or tobacco, such as cigarettes and e-cigarettes. If you need help quitting, ask your  health care provider.  Keep all follow-up visits as told by your health care provider. This is important. Contact a health care provider if:  Your periods are late, irregular, or painful, or they stop.  You have pelvic pain that does not go away.  You have pressure on your bladder or trouble emptying your bladder completely.  You have pain during sex.  You have any of the following in your abdomen: ? A feeling of fullness. ? Pressure. ? Discomfort. ? Pain that does not go away. ? Swelling.  You feel generally ill.  You become constipated.  You lose your appetite.  You develop severe acne.  You start to have more body hair and facial hair.  You are gaining weight or losing weight without changing your exercise and eating habits.  You think you may be pregnant. Get help right away if:  You have abdominal pain that is severe or gets worse.  You cannot eat or drink without vomiting.  You suddenly develop a fever.  Your menstrual period is much heavier than usual. This information is not intended to replace advice given to you by your health care  provider. Make sure you discuss any questions you have with your health care provider. Document Released: 04/11/2005 Document Revised: 10/30/2015 Document Reviewed: 09/13/2015 Elsevier Interactive Patient Education  [employer].

## 2017-08-25 LAB — URINE CULTURE

## 2017-08-26 ENCOUNTER — Encounter: Payer: Self-pay | Admitting: Family Medicine

## 2017-08-26 ENCOUNTER — Other Ambulatory Visit: Payer: Self-pay

## 2017-08-26 ENCOUNTER — Ambulatory Visit (INDEPENDENT_AMBULATORY_CARE_PROVIDER_SITE_OTHER): Payer: PPO | Admitting: Family Medicine

## 2017-08-26 VITALS — BP 130/68 | HR 78 | Temp 98.6°F | Resp 16 | Ht 61.0 in | Wt 138.6 lb

## 2017-08-26 DIAGNOSIS — L089 Local infection of the skin and subcutaneous tissue, unspecified: Secondary | ICD-10-CM

## 2017-08-26 DIAGNOSIS — L723 Sebaceous cyst: Secondary | ICD-10-CM | POA: Diagnosis not present

## 2017-08-26 LAB — WOUND CULTURE

## 2017-08-26 NOTE — Patient Instructions (Addendum)
     IF you received an x-ray today, you will receive an invoice from [facility]. Please contact [facility] at [phone] with questions or concerns regarding your invoice.   IF you received labwork today, you will receive an invoice from [place]. Please contact [facility] at [phone] with questions or concerns regarding your invoice.   Our billing staff will not be able to assist you with questions regarding bills from these companies.  You will be contacted with the lab results as soon as they are available. The fastest way to get your results is to activate your My Chart account. Instructions are located on the last page of this paperwork. If you have not heard from [place] regarding the results in 2 weeks, please contact this office.     Incision and Drainage, Care After Refer to this sheet in the next few weeks. These instructions provide you with information about caring for yourself after your procedure. Your health care provider may also give you more specific instructions. Your treatment has been planned according to current medical practices, but problems sometimes occur. Call your health care provider if you have any problems or questions after your procedure. What can I expect after the procedure? After the procedure, it is common to have:  Pain or discomfort around your incision site.  Drainage from your incision.  Follow these instructions at home:  Take over-the-counter and prescription medicines only as told by your health care provider.  If you were prescribed an antibiotic medicine, take it as told by your health care provider.Do not stop taking the antibiotic even if you start to feel better.  Followinstructions from your health care provider about: ? How to take care of your incision. ? When and how you should change your packing and bandage (dressing). Wash your hands with soap and water before you change your dressing. If soap and water are  not available, use hand sanitizer. ? When you should remove your dressing.  Do not take baths, swim, or use a hot tub until your health care provider approves.  Keep all follow-up visits as told by your health care provider. This is important.  Check your incision area every day for signs of infection. Check for: ? More redness, swelling, or pain. ? More fluid or blood. ? Warmth. ? Pus or a bad smell. Contact a health care provider if:  Your cyst or abscess returns.  You have a fever.  You have more redness, swelling, or pain around your incision.  You have more fluid or blood coming from your incision.  Your incision feels warm to the touch.  You have pus or a bad smell coming from your incision. Get help right away if:  You have severe pain or bleeding.  You cannot eat or drink without vomiting.  You have decreased urine output.  You become short of breath.  You have chest pain.  You cough up blood.  The area where the incision and drainage occurred becomes numb or it tingles. This information is not intended to replace advice given to you by your health care provider. Make sure you discuss any questions you have with your health care provider. Document Released: 07/04/2011 Document Revised: 09/11/2015 Document Reviewed: 01/30/2015 Elsevier Interactive Patient Education  [employer].

## 2017-08-26 NOTE — Progress Notes (Signed)
Subjective:  By signing my name below, I, Essence Howell, attest that this documentation has been prepared under the direction and in the presence of Delman Cheadle, MD Electronically Signed: Ladene Artist, ED Scribe 08/26/2017 at 9:15 AM.   Patient ID: Rhonda Burns, female    DOB: 1945/09/23, 72 y.o.   MRN: [id number]  Chief Complaint  Patient presents with  . Wound Check   HPI Rhonda Burns is a 72 y.o. female who presents to Primary Care at [facility] for wound check.   Past Medical History:  Diagnosis Date  . Abdominal pain, chronic, right upper quadrant   . Allergic rhinitis, cause unspecified   . Allergy   . Asthma   . Cataract    growing cataracts  . GERD (gastroesophageal reflux disease)   . Hiatal hernia   . Hyperlipidemia   . Hypertension   . Osteoporosis   . Osteoporosis   . Schatzki's ring    Current Outpatient Medications on File Prior to Visit  Medication Sig Dispense Refill  . acetaminophen (TYLENOL) 500 MG tablet Take 500 mg by mouth every 8 (eight) hours as needed.    Marland Kitchen albuterol (VENTOLIN HFA) 108 (90 Base) MCG/ACT inhaler Inhale 2 puffs into the lungs every 6 (six) hours as needed for wheezing. 3 Inhaler 0  . aspirin 325 MG tablet Take 325 mg by mouth daily.    . calcium gluconate 500 MG tablet Take 500 mg by mouth daily.    . Cholecalciferol (D3 SUPER STRENGTH) 2000 UNITS CAPS Take by mouth once.    . doxycycline (VIBRA-TABS) 100 MG tablet Take 1 tablet (100 mg total) by mouth 2 (two) times daily. 20 tablet 0  . fluticasone (FLONASE) 50 MCG/ACT nasal spray Place 2 sprays into both nostrils daily as needed for allergies. 16 g 5  . lisinopril (PRINIVIL,ZESTRIL) 5 MG tablet Take 1 tablet (5 mg total) daily by mouth. 90 tablet 3  . meloxicam (MOBIC) 15 MG tablet Take 1 tablet (15 mg total) by mouth daily. After prednisone is complete 30 tablet 0  . pravastatin (PRAVACHOL) 40 MG tablet TAKE 1 TABLET DAILY. 90 tablet 3  . triamcinolone cream (KENALOG) 0.1 % Apply 1  application topically 2 (two) times daily. 80 g 1   No current facility-administered medications on file prior to visit.    Past Surgical History:  Procedure Laterality Date  . CESAREAN SECTION    . COLONOSCOPY      Allergies  Allergen Reactions  . Other Swelling    Pecans and coconut   Family History  Problem Relation Age of Onset  . Alzheimer's disease Mother   . Heart disease Mother   . Stroke Mother   . Asthma Sister   . Heart disease Father   . Heart attack Father   . Diabetes Father   . Colon cancer Neg Hx   . Esophageal cancer Neg Hx    Social History   Socioeconomic History  . Marital status: Widowed    Spouse name: Not on file  . Number of children: 1  . Years of education: 17  . Highest education level: Some college, no degree  Occupational History  . Occupation: retired  Scientific laboratory technician  . Financial resource strain: Not hard at all  . Food insecurity:    Worry: Never true    Inability: Never true  . Transportation needs:    Medical: No    Non-medical: No  Tobacco Use  . Smoking status: Former Research scientist (life sciences)  .  Smokeless tobacco: Never Used  Substance and Sexual Activity  . Alcohol use: No    Alcohol/week: 0.0 standard drinks  . Drug use: No  . Sexual activity: Not on file  Lifestyle  . Physical activity:    Days per week: 7 days    Minutes per session: 120 min  . Stress: Not at all  Relationships  . Social connections:    Talks on phone: More than three times a week    Gets together: More than three times a week    Attends religious service: More than 4 times per year    Active member of club or organization: Yes    Attends meetings of clubs or organizations: More than 4 times per year    Relationship status: Widowed  Other Topics Concern  . Not on file  Social History Narrative   Exercise walking 7 days per week for 1 hour   Depression screen [facility] 2/9 09/02/2017 09/02/2017 08/31/2017 08/26/2017 08/24/2017  Decreased Interest 0 0 0 0 0  Down, Depressed,  Hopeless 0 0 0 0 0  PHQ - 2 Score 0 0 0 0 0     Review of Systems  Skin: Positive for wound.      Objective:   Physical Exam  Constitutional: She is oriented to person, place, and time. She appears well-developed and well-nourished. No distress.  HENT:  Head: Normocephalic and atraumatic.  Eyes: Conjunctivae and EOM are normal.  Neck: Neck supple. No tracheal deviation present.  Cardiovascular: Normal rate.  Pulmonary/Chest: Effort normal. No respiratory distress.  Musculoskeletal: Normal range of motion.  Neurological: She is alert and oriented to person, place, and time.  Skin: Skin is warm and dry. Laceration (in left mid lumbar back, + purulent drainage and tenderness. no erythema or warmth surrounding healthy pink laceration edges) noted.  Psychiatric: She has a normal mood and affect. Her behavior is normal.  Nursing note and vitals reviewed.  BP 130/68   Pulse 78   Temp 98.6 F (37 C)   Resp 16   Ht 5\' 1"  (1.549 m)   Wt 138 lb 9.6 oz (62.9 kg)   SpO2 97%   BMI 26.19 kg/m     Assessment & Plan:   1. Infected sebaceous cyst    Dressing removed. Iodoform gauze removed from wound. Flushed with 15cc 1% lidocaine under pressure. Repacked with gauze and nonstick dressing with mupirocin applied. Recheck in 2d for another dressing change.  Delman Cheadle, MD, MPH Primary Care at [facility] [address] [place], [place]  [address] [phone] Office phone  [phone] Office fax   01/29/18 8:01 AM

## 2017-08-28 ENCOUNTER — Encounter: Payer: Self-pay | Admitting: Family Medicine

## 2017-08-28 ENCOUNTER — Ambulatory Visit (INDEPENDENT_AMBULATORY_CARE_PROVIDER_SITE_OTHER): Payer: PPO | Admitting: Family Medicine

## 2017-08-28 VITALS — BP 144/72 | HR 75 | Temp 98.4°F | Resp 18 | Ht 61.0 in | Wt 138.6 lb

## 2017-08-28 DIAGNOSIS — L089 Local infection of the skin and subcutaneous tissue, unspecified: Secondary | ICD-10-CM

## 2017-08-28 DIAGNOSIS — L723 Sebaceous cyst: Secondary | ICD-10-CM

## 2017-08-28 NOTE — Patient Instructions (Signed)
     IF you received an x-ray today, you will receive an invoice from [facility]. Please contact [facility] at [phone] with questions or concerns regarding your invoice.   IF you received labwork today, you will receive an invoice from [facility]. Please contact [facility] at [phone] with questions or concerns regarding your invoice.   Our billing staff will not be able to assist you with questions regarding bills from these companies.  You will be contacted with the lab results as soon as they are available. The fastest way to get your results is to activate your My Chart account. Instructions are located on the last page of this paperwork. If you have not heard from us regarding the results in 2 weeks, please contact this office.     

## 2017-08-28 NOTE — Progress Notes (Signed)
Subjective:  By signing my name below, I, Essence Howell, attest that this documentation has been prepared under the direction and in the presence of Delman Cheadle, MD Electronically Signed: Ladene Artist, ED Scribe 08/28/2017 at 11:57 AM.   Patient ID: Rhonda Burns, female    DOB: 22-May-1945, 72 y.o.   MRN: [id number]  Chief Complaint  Patient presents with  . Wound Check    abscess  . Follow-up    x2 days   HPI Rhonda Burns is a 72 y.o. female who presents to Primary Care at [facility] for wound check.  She has not changed the dressing though it is leaking some purulent material. It is hurting a litltle less, starting to feel better.   Past Medical History:  Diagnosis Date  . Abdominal pain, chronic, right upper quadrant   . Allergic rhinitis, cause unspecified   . Allergy   . Asthma   . Cataract    growing cataracts  . GERD (gastroesophageal reflux disease)   . Hiatal hernia   . Hyperlipidemia   . Hypertension   . Osteoporosis   . Osteoporosis   . Schatzki's ring    Current Outpatient Medications on File Prior to Visit  Medication Sig Dispense Refill  . acetaminophen (TYLENOL) 500 MG tablet Take 500 mg by mouth every 8 (eight) hours as needed.    Marland Kitchen albuterol (VENTOLIN HFA) 108 (90 Base) MCG/ACT inhaler Inhale 2 puffs into the lungs every 6 (six) hours as needed for wheezing. 3 Inhaler 0  . aspirin 325 MG tablet Take 325 mg by mouth daily.    . calcium gluconate 500 MG tablet Take 500 mg by mouth daily.    . Cholecalciferol (D3 SUPER STRENGTH) 2000 UNITS CAPS Take by mouth once.    . doxycycline (VIBRA-TABS) 100 MG tablet Take 1 tablet (100 mg total) by mouth 2 (two) times daily. 20 tablet 0  . fluticasone (FLONASE) 50 MCG/ACT nasal spray Place 2 sprays into both nostrils daily as needed for allergies. 16 g 5  . lisinopril (PRINIVIL,ZESTRIL) 5 MG tablet Take 1 tablet (5 mg total) daily by mouth. 90 tablet 3  . meloxicam (MOBIC) 15 MG tablet Take 1 tablet (15 mg total) by mouth  daily. After prednisone is complete 30 tablet 0  . pravastatin (PRAVACHOL) 40 MG tablet TAKE 1 TABLET DAILY. 90 tablet 3  . triamcinolone cream (KENALOG) 0.1 % Apply 1 application topically 2 (two) times daily. 80 g 1   No current facility-administered medications on file prior to visit.    Past Surgical History:  Procedure Laterality Date  . CESAREAN SECTION    . COLONOSCOPY     Allergies  Allergen Reactions  . Other Swelling    Pecans and coconut   Family History  Problem Relation Age of Onset  . Alzheimer's disease Mother   . Heart disease Mother   . Stroke Mother   . Asthma Sister   . Heart disease Father   . Heart attack Father   . Diabetes Father   . Colon cancer Neg Hx   . Esophageal cancer Neg Hx    Social History   Socioeconomic History  . Marital status: Widowed    Spouse name: Not on file  . Number of children: 1  . Years of education: 64  . Highest education level: Some college, no degree  Occupational History  . Occupation: retired  Scientific laboratory technician  . Financial resource strain: Not hard at all  . Food insecurity:  Worry: Never true    Inability: Never true  . Transportation needs:    Medical: No    Non-medical: No  Tobacco Use  . Smoking status: Former Research scientist (life sciences)  . Smokeless tobacco: Never Used  Substance and Sexual Activity  . Alcohol use: No    Alcohol/week: 0.0 standard drinks  . Drug use: No  . Sexual activity: Not on file  Lifestyle  . Physical activity:    Days per week: 7 days    Minutes per session: 120 min  . Stress: Not at all  Relationships  . Social connections:    Talks on phone: More than three times a week    Gets together: More than three times a week    Attends religious service: More than 4 times per year    Active member of club or organization: Yes    Attends meetings of clubs or organizations: More than 4 times per year    Relationship status: Widowed  Other Topics Concern  . Not on file  Social History Narrative    Exercise walking 7 days per week for 1 hour   Depression screen [facility] 2/9 09/02/2017 09/02/2017 08/31/2017 08/26/2017 08/24/2017  Decreased Interest 0 0 0 0 0  Down, Depressed, Hopeless 0 0 0 0 0  PHQ - 2 Score 0 0 0 0 0     Review of Systems  Skin: Positive for wound.      Objective:   Physical Exam  Constitutional: She is oriented to person, place, and time. She appears well-developed and well-nourished. No distress.  HENT:  Head: Normocephalic and atraumatic.  Eyes: Conjunctivae and EOM are normal.  Neck: Neck supple. No tracheal deviation present.  Cardiovascular: Normal rate.  Pulmonary/Chest: Effort normal. No respiratory distress.  Musculoskeletal: Normal range of motion.  Neurological: She is alert and oriented to person, place, and time.  Skin: Skin is warm and dry.  Psychiatric: She has a normal mood and affect. Her behavior is normal.  Nursing note and vitals reviewed.  BP (!) 144/72 (BP Location: Left Arm, Patient Position: Sitting, Cuff Size: Normal)   Pulse 75   Temp 98.4 F (36.9 C) (Oral)   Resp 18   Ht 5\' 1"  (1.549 m)   Wt 138 lb 9.6 oz (62.9 kg)   SpO2 97%   BMI 26.19 kg/m     Assessment & Plan:   1. Infected sebaceous cyst   Iodoform gauze removed.  Patient's wound flushed with 10 cc of 1% plain lidocaine under pressure and 15 cc of sterile water.  Repacked with approximately iodoform gauze and dressing applied using mupirocin and nonstick Telfa with Tegaderm.  Continue doxycycline.   Recheck in 3 days for another dressing change.  Delman Cheadle, MD, MPH Primary Care at [facility] [address] [place], [place]  [address] [phone] Office phone  [phone] Office fax   01/29/18 8:04 AM

## 2017-08-31 ENCOUNTER — Encounter: Payer: Self-pay | Admitting: Family Medicine

## 2017-08-31 ENCOUNTER — Other Ambulatory Visit: Payer: Self-pay

## 2017-08-31 ENCOUNTER — Ambulatory Visit (INDEPENDENT_AMBULATORY_CARE_PROVIDER_SITE_OTHER): Payer: PPO | Admitting: Family Medicine

## 2017-08-31 ENCOUNTER — Ambulatory Visit (HOSPITAL_COMMUNITY)
Admission: RE | Admit: 2017-08-31 | Discharge: 2017-08-31 | Disposition: A | Discharge status: Home / Self Care | Payer: PPO | Source: Ambulatory Visit | Attending: Family Medicine | Admitting: Family Medicine

## 2017-08-31 VITALS — BP 134/78 | HR 89 | Temp 98.5°F | Resp 18 | Ht 61.0 in | Wt 138.6 lb

## 2017-08-31 DIAGNOSIS — L723 Sebaceous cyst: Secondary | ICD-10-CM

## 2017-08-31 DIAGNOSIS — G8929 Other chronic pain: Secondary | ICD-10-CM | POA: Diagnosis not present

## 2017-08-31 DIAGNOSIS — L089 Local infection of the skin and subcutaneous tissue, unspecified: Secondary | ICD-10-CM

## 2017-08-31 DIAGNOSIS — N83201 Unspecified ovarian cyst, right side: Secondary | ICD-10-CM | POA: Diagnosis not present

## 2017-08-31 DIAGNOSIS — R1031 Right lower quadrant pain: Secondary | ICD-10-CM | POA: Insufficient documentation

## 2017-08-31 DIAGNOSIS — R102 Pelvic and perineal pain: Secondary | ICD-10-CM | POA: Diagnosis not present

## 2017-08-31 NOTE — Progress Notes (Signed)
Subjective:    Patient ID: Rhonda Burns, female    DOB: 1946/04/24, 72 y.o.   MRN: [id number] Chief Complaint  Patient presents with  . Abcess    lower back  . Follow-up    HPI Doing better. Has not changed dressing since last visit.  Had pelvic/tv US sched for today due to her concerns about the ovarian cyst and worsening pelvic pain.  Past Medical History:  Diagnosis Date  . Abdominal pain, chronic, right upper quadrant   . Allergic rhinitis, cause unspecified   . Allergy   . Asthma   . Cataract    growing cataracts  . GERD (gastroesophageal reflux disease)   . Hiatal hernia   . Hyperlipidemia   . Hypertension   . Osteoporosis   . Osteoporosis   . Schatzki's ring    Past Surgical History:  Procedure Laterality Date  . CESAREAN SECTION    . COLONOSCOPY     Current Outpatient Medications on File Prior to Visit  Medication Sig Dispense Refill  . acetaminophen (TYLENOL) 500 MG tablet Take 500 mg by mouth every 8 (eight) hours as needed.    Marland Kitchen albuterol (VENTOLIN HFA) 108 (90 Base) MCG/ACT inhaler Inhale 2 puffs into the lungs every 6 (six) hours as needed for wheezing. 3 Inhaler 0  . aspirin 325 MG tablet Take 325 mg by mouth daily.    . calcium gluconate 500 MG tablet Take 500 mg by mouth daily.    . Cholecalciferol (D3 SUPER STRENGTH) 2000 UNITS CAPS Take by mouth once.    . fluticasone (FLONASE) 50 MCG/ACT nasal spray Place 2 sprays into both nostrils daily as needed for allergies. 16 g 5  . lisinopril (PRINIVIL,ZESTRIL) 5 MG tablet Take 1 tablet (5 mg total) daily by mouth. 90 tablet 3  . meloxicam (MOBIC) 15 MG tablet Take 1 tablet (15 mg total) by mouth daily. After prednisone is complete 30 tablet 0  . pravastatin (PRAVACHOL) 40 MG tablet TAKE 1 TABLET DAILY. 90 tablet 3   No current facility-administered medications on file prior to visit.    Allergies  Allergen Reactions  . Other Swelling    Pecans and coconut   Family History  Problem Relation Age of  Onset  . Alzheimer's disease Mother   . Heart disease Mother   . Stroke Mother   . Asthma Sister   . Heart disease Father   . Heart attack Father   . Diabetes Father   . Colon cancer Neg Hx   . Esophageal cancer Neg Hx    Social History   Socioeconomic History  . Marital status: Widowed    Spouse name: Not on file  . Number of children: 1  . Years of education: 57  . Highest education level: Some college, no degree  Occupational History  . Occupation: retired  Scientific laboratory technician  . Financial resource strain: Not hard at all  . Food insecurity:    Worry: Never true    Inability: Never true  . Transportation needs:    Medical: No    Non-medical: No  Tobacco Use  . Smoking status: Former Research scientist (life sciences)  . Smokeless tobacco: Never Used  Substance and Sexual Activity  . Alcohol use: No    Alcohol/week: 0.0 standard drinks  . Drug use: No  . Sexual activity: Not on file  Lifestyle  . Physical activity:    Days per week: 7 days    Minutes per session: 120 min  . Stress: Not at  all  Relationships  . Social connections:    Talks on phone: More than three times a week    Gets together: More than three times a week    Attends religious service: More than 4 times per year    Active member of club or organization: Yes    Attends meetings of clubs or organizations: More than 4 times per year    Relationship status: Widowed  Other Topics Concern  . Not on file  Social History Narrative   Exercise walking 7 days per week for 1 hour   Depression screen [facility] 2/9 09/02/2017 09/02/2017 08/31/2017 08/26/2017 08/24/2017  Decreased Interest 0 0 0 0 0  Down, Depressed, Hopeless 0 0 0 0 0  PHQ - 2 Score 0 0 0 0 0       Review of Systems  Constitutional: Positive for activity change. Negative for chills, diaphoresis and fever.  Musculoskeletal: Positive for myalgias. Negative for gait problem and joint swelling.  Skin: Positive for color change and rash. Negative for pallor and wound.    Hematological: Negative for adenopathy. Does not bruise/bleed easily.  Psychiatric/Behavioral: Positive for sleep disturbance. The patient is nervous/anxious.        Objective:   Physical Exam  Constitutional: She is oriented to person, place, and time. She appears well-developed and well-nourished. No distress.  HENT:  Head: Normocephalic and atraumatic.  Right Ear: External ear normal.  Eyes: Conjunctivae are normal. No scleral icterus.  Pulmonary/Chest: Effort normal.  Neurological: She is alert and oriented to person, place, and time.  Skin: Skin is warm and dry. Laceration noted. She is not diaphoretic. No erythema.     Psychiatric: She has a normal mood and affect. Her behavior is normal.   Iodoform gauze removed and abscess flushed with sterile saline.  Minimal amount of purulent discharge and drainage obtained but there was some so repacked with iodoform gauze.  Redressed.   BP 134/78 (BP Location: Left Arm, Patient Position: Sitting, Cuff Size: Normal)   Pulse 89   Temp 98.5 F (36.9 C) (Oral)   Resp 18   Ht 5\' 1"  (1.549 m)   Wt 138 lb 9.6 oz (62.9 kg)   SpO2 97%   BMI 26.19 kg/m      Assessment & Plan:   1. Infected sebaceous cyst   S/p I&D on 5/2 - on doxyxycline follow-up in 2 d for repeat wound care Delman Cheadle, MD, MPH Primary Care at [facility] [address] [place], [place]  [address] [phone] Office phone  [phone] Office fax   01/29/18 8:08 AM

## 2017-08-31 NOTE — Patient Instructions (Addendum)
     IF you received an x-ray today, you will receive an invoice from [facility]. Please contact [facility] at [phone] with questions or concerns regarding your invoice.   IF you received labwork today, you will receive an invoice from [facility]. Please contact [facility] at [phone] with questions or concerns regarding your invoice.   Our billing staff will not be able to assist you with questions regarding bills from these companies.  You will be contacted with the lab results as soon as they are available. The fastest way to get your results is to activate your My Chart account. Instructions are located on the last page of this paperwork. If you have not heard from [place] regarding the results in 2 weeks, please contact this office.     Incision and Drainage, Care After Refer to this sheet in the next few weeks. These instructions provide you with information about caring for yourself after your procedure. Your health care provider may also give you more specific instructions. Your treatment has been planned according to current medical practices, but problems sometimes occur. Call your health care provider if you have any problems or questions after your procedure. What can I expect after the procedure? After the procedure, it is common to have:  Pain or discomfort around your incision site.  Drainage from your incision.  Follow these instructions at home:  Take over-the-counter and prescription medicines only as told by your health care provider.  If you were prescribed an antibiotic medicine, take it as told by your health care provider.Do not stop taking the antibiotic even if you start to feel better.  Followinstructions from your health care provider about: ? How to take care of your incision. ? When and how you should change your packing and bandage (dressing). Wash your hands with soap and water before you change your dressing. If soap and water are  not available, use hand sanitizer. ? When you should remove your dressing.  Do not take baths, swim, or use a hot tub until your health care provider approves.  Keep all follow-up visits as told by your health care provider. This is important.  Check your incision area every day for signs of infection. Check for: ? More redness, swelling, or pain. ? More fluid or blood. ? Warmth. ? Pus or a bad smell. Contact a health care provider if:  Your cyst or abscess returns.  You have a fever.  You have more redness, swelling, or pain around your incision.  You have more fluid or blood coming from your incision.  Your incision feels warm to the touch.  You have pus or a bad smell coming from your incision. Get help right away if:  You have severe pain or bleeding.  You cannot eat or drink without vomiting.  You have decreased urine output.  You become short of breath.  You have chest pain.  You cough up blood.  The area where the incision and drainage occurred becomes numb or it tingles. This information is not intended to replace advice given to you by your health care provider. Make sure you discuss any questions you have with your health care provider. Document Released: 07/04/2011 Document Revised: 09/11/2015 Document Reviewed: 01/30/2015 Elsevier Interactive Patient Education  [employer].

## 2017-09-02 ENCOUNTER — Other Ambulatory Visit: Payer: Self-pay

## 2017-09-02 ENCOUNTER — Encounter: Payer: Self-pay | Admitting: Family Medicine

## 2017-09-02 ENCOUNTER — Ambulatory Visit (INDEPENDENT_AMBULATORY_CARE_PROVIDER_SITE_OTHER): Payer: PPO | Admitting: Family Medicine

## 2017-09-02 VITALS — BP 128/80 | HR 74 | Temp 98.5°F | Resp 16 | Ht 61.0 in | Wt 139.4 lb

## 2017-09-02 DIAGNOSIS — G8929 Other chronic pain: Secondary | ICD-10-CM

## 2017-09-02 DIAGNOSIS — L723 Sebaceous cyst: Secondary | ICD-10-CM

## 2017-09-02 DIAGNOSIS — L089 Local infection of the skin and subcutaneous tissue, unspecified: Secondary | ICD-10-CM

## 2017-09-02 DIAGNOSIS — N83201 Unspecified ovarian cyst, right side: Secondary | ICD-10-CM

## 2017-09-02 DIAGNOSIS — R1031 Right lower quadrant pain: Secondary | ICD-10-CM

## 2017-09-02 MED ORDER — TRIAMCINOLONE ACETONIDE 0.1 % EX CREA: 1.0000 "application " | TOPICAL_CREAM | Freq: Two times a day (BID) | CUTANEOUS | 1 refills | Status: DC

## 2017-09-02 MED ORDER — MUPIROCIN 2 % EX OINT: 1.0000 "application " | TOPICAL_OINTMENT | Freq: Two times a day (BID) | CUTANEOUS | 1 refills | Status: DC

## 2017-09-02 MED ORDER — DOXYCYCLINE HYCLATE 100 MG PO TABS: 100.0000 mg | ORAL_TABLET | Freq: Two times a day (BID) | ORAL | 0 refills | Status: DC

## 2017-09-02 NOTE — Patient Instructions (Addendum)
Warm compress over the bandaid several times a day to keep this draining as long as it needs to.  When you shower, fine to remove the bandaid and leave it open but make sure you wash the soap off with water very well and apply anbitiobic ointment immediately after.  Whenever you change the bandaid - after you shower or whenever it peals off or is leaking through - once to twice a day - make sure you apply a wet warm compress before and ALWAYS reapply the antibiotic ointment.  Make sure you come back if it is getting larger, more tender, draining pus, or any other concerns.    IF you received an x-ray today, you will receive an invoice from [facility]. Please contact [facility] at [phone] with questions or concerns regarding your invoice.   IF you received labwork today, you will receive an invoice from [facility]. Please contact [facility] at [phone] with questions or concerns regarding your invoice.   Our billing staff will not be able to assist you with questions regarding bills from these companies.  You will be contacted with the lab results as soon as they are available. The fastest way to get your results is to activate your My Chart account. Instructions are located on the last page of this paperwork. If you have not heard from [place] regarding the results in 2 weeks, please contact this office.     Incision and Drainage, Care After Refer to this sheet in the next few weeks. These instructions provide you with information about caring for yourself after your procedure. Your health care provider may also give you more specific instructions. Your treatment has been planned according to current medical practices, but problems sometimes occur. Call your health care provider if you have any problems or questions after your procedure. What can I expect after the procedure? After the procedure, it is common to have:  Pain or discomfort around your incision site.  Drainage  from your incision.  Follow these instructions at home:  Take over-the-counter and prescription medicines only as told by your health care provider.  If you were prescribed an antibiotic medicine, take it as told by your health care provider.Do not stop taking the antibiotic even if you start to feel better.  Followinstructions from your health care provider about: ? How to take care of your incision. ? When and how you should change your packing and bandage (dressing). Wash your hands with soap and water before you change your dressing. If soap and water are not available, use hand sanitizer. ? When you should remove your dressing.  Do not take baths, swim, or use a hot tub until your health care provider approves.  Keep all follow-up visits as told by your health care provider. This is important.  Check your incision area every day for signs of infection. Check for: ? More redness, swelling, or pain. ? More fluid or blood. ? Warmth. ? Pus or a bad smell. Contact a health care provider if:  Your cyst or abscess returns.  You have a fever.  You have more redness, swelling, or pain around your incision.  You have more fluid or blood coming from your incision.  Your incision feels warm to the touch.  You have pus or a bad smell coming from your incision. Get help right away if:  You have severe pain or bleeding.  You cannot eat or drink without vomiting.  You have decreased urine output.  You become short  of breath.  You have chest pain.  You cough up blood.  The area where the incision and drainage occurred becomes numb or it tingles. This information is not intended to replace advice given to you by your health care provider. Make sure you discuss any questions you have with your health care provider. Document Released: 07/04/2011 Document Revised: 09/11/2015 Document Reviewed: 01/30/2015 Elsevier Interactive Patient Education  2018 [employer].  Epidermal  Cyst An epidermal cyst is sometimes called an epidermal inclusion cyst or an infundibular cyst. It is a sac made of skin tissue. The sac contains a substance called keratin. Keratin is a protein that is normally secreted through the hair follicles. When keratin becomes trapped in the top layer of skin (epidermis), it can form an epidermal cyst. Epidermal cysts are usually found on the face, neck, trunk, and genitals. These cysts are usually harmless (benign), and they may not cause symptoms unless they become infected. It is important not to pop epidermal cysts yourself. What are the causes? This condition may be caused by:  A blocked hair follicle.  A hair that curls and re-enters the skin instead of growing straight out of the skin (ingrown hair).  A blocked pore.  Irritated skin.  An injury to the skin.  Certain conditions that are passed along from parent to child (inherited).  Human papillomavirus (HPV).  What increases the risk? The following factors may make you more likely to develop an epidermal cyst:  Having acne.  Being overweight.  Wearing tight clothing.  What are the signs or symptoms? The only symptom of this condition may be a small, painless lump underneath the skin. When an epidermal cyst becomes infected, symptoms may include:  Redness.  Inflammation.  Tenderness.  Warmth.  Fever.  Keratin draining from the cyst. Keratin may look like a grayish-white, bad-smelling substance.  Pus draining from the cyst.  How is this diagnosed? This condition is diagnosed with a physical exam. In some cases, you may have a sample of tissue (biopsy) taken from your cyst to be examined under a microscope or tested for bacteria. You may be referred to a health care provider who specializes in skin care (dermatologist). How is this treated? In many cases, epidermal cysts go away on their own without treatment. If a cyst becomes infected, treatment may include:  Opening  and draining the cyst. After draining, minor surgery to remove the rest of the cyst may be done.  Antibiotic medicine to help prevent infection.  Injections of medicines (steroids) that help to reduce inflammation.  Surgery to remove the cyst. Surgery may be done if: ? The cyst becomes large. ? The cyst bothers you. ? There is a chance that the cyst could turn into cancer.  Follow these instructions at home:  Take over-the-counter and prescription medicines only as told by your health care provider.  If you were prescribed an antibiotic, use it as told by your health care provider. Do not stop using the antibiotic even if you start to feel better.  Keep the area around your cyst clean and dry.  Wear loose, dry clothing.  Do not try to pop your cyst.  Avoid touching your cyst.  Check your cyst every day for signs of infection.  Keep all follow-up visits as told by your health care provider. This is important. How is this prevented?  Wear clean, dry, clothing.  Avoid wearing tight clothing.  Keep your skin clean and dry. Shower or take baths every day.  Wash your body with a benzoyl peroxide wash when you shower or bathe. Contact a health care provider if:  Your cyst develops symptoms of infection.  Your condition is not improving or is getting worse.  You develop a cyst that looks different from other cysts you have had.  You have a fever. Get help right away if:  Redness spreads from the cyst into the surrounding area. This information is not intended to replace advice given to you by your health care provider. Make sure you discuss any questions you have with your health care provider. Document Released: 03/12/2004 Document Revised: 12/09/2015 Document Reviewed: 02/11/2015 Elsevier Interactive Patient Education  [employer].

## 2017-09-02 NOTE — Progress Notes (Signed)
Subjective:  By signing my name below, I, Essence Howell, attest that this documentation has been prepared under the direction and in the presence of Delman Cheadle, MD Electronically Signed: Ladene Artist, ED Scribe 09/02/2017 at 3:59 PM.   Patient ID: Rhonda Burns, female    DOB: 01-Jan-1946, 72 y.o.   MRN: [id number]  Chief Complaint  Patient presents with  . Wound Check   HPI Rhonda Burns is a 72 y.o. female who presents to Primary Care at [facility] for wound check.   Past Medical History:  Diagnosis Date  . Abdominal pain, chronic, right upper quadrant   . Allergic rhinitis, cause unspecified   . Allergy   . Asthma   . Cataract    growing cataracts  . GERD (gastroesophageal reflux disease)   . Hiatal hernia   . Hyperlipidemia   . Hypertension   . Osteoporosis   . Osteoporosis   . Schatzki's ring    Current Outpatient Medications on File Prior to Visit  Medication Sig Dispense Refill  . acetaminophen (TYLENOL) 500 MG tablet Take 500 mg by mouth every 8 (eight) hours as needed.    Marland Kitchen albuterol (VENTOLIN HFA) 108 (90 Base) MCG/ACT inhaler Inhale 2 puffs into the lungs every 6 (six) hours as needed for wheezing. 3 Inhaler 0  . aspirin 325 MG tablet Take 325 mg by mouth daily.    . calcium gluconate 500 MG tablet Take 500 mg by mouth daily.    . Cholecalciferol (D3 SUPER STRENGTH) 2000 UNITS CAPS Take by mouth once.    . doxycycline (VIBRA-TABS) 100 MG tablet Take 1 tablet (100 mg total) by mouth 2 (two) times daily. 20 tablet 0  . fluticasone (FLONASE) 50 MCG/ACT nasal spray Place 2 sprays into both nostrils daily as needed for allergies. 16 g 5  . lisinopril (PRINIVIL,ZESTRIL) 5 MG tablet Take 1 tablet (5 mg total) daily by mouth. 90 tablet 3  . meloxicam (MOBIC) 15 MG tablet Take 1 tablet (15 mg total) by mouth daily. After prednisone is complete 30 tablet 0  . pravastatin (PRAVACHOL) 40 MG tablet TAKE 1 TABLET DAILY. 90 tablet 3  . triamcinolone cream (KENALOG) 0.1 % Apply 1  application topically 2 (two) times daily. 80 g 1   No current facility-administered medications on file prior to visit.    Past Surgical History:  Procedure Laterality Date  . CESAREAN SECTION    . COLONOSCOPY     Allergies  Allergen Reactions  . Other Swelling    Pecans and coconut   Family History  Problem Relation Age of Onset  . Alzheimer's disease Mother   . Heart disease Mother   . Stroke Mother   . Asthma Sister   . Heart disease Father   . Heart attack Father   . Diabetes Father   . Colon cancer Neg Hx   . Esophageal cancer Neg Hx    Social History   Socioeconomic History  . Marital status: Widowed    Spouse name: Not on file  . Number of children: 1  . Years of education: 91  . Highest education level: Some college, no degree  Occupational History  . Occupation: retired  Scientific laboratory technician  . Financial resource strain: Not hard at all  . Food insecurity:    Worry: Never true    Inability: Never true  . Transportation needs:    Medical: No    Non-medical: No  Tobacco Use  . Smoking status: Former Research scientist (life sciences)  . Smokeless  tobacco: Never Used  Substance and Sexual Activity  . Alcohol use: No    Alcohol/week: 0.0 standard drinks  . Drug use: No  . Sexual activity: Not on file  Lifestyle  . Physical activity:    Days per week: 7 days    Minutes per session: 120 min  . Stress: Not at all  Relationships  . Social connections:    Talks on phone: More than three times a week    Gets together: More than three times a week    Attends religious service: More than 4 times per year    Active member of club or organization: Yes    Attends meetings of clubs or organizations: More than 4 times per year    Relationship status: Widowed  Other Topics Concern  . Not on file  Social History Narrative   Exercise walking 7 days per week for 1 hour   Depression screen [facility] 2/9 09/02/2017 09/02/2017 08/31/2017 08/26/2017 08/24/2017  Decreased Interest 0 0 0 0 0  Down, Depressed,  Hopeless 0 0 0 0 0  PHQ - 2 Score 0 0 0 0 0     Review of Systems   see hpi Objective:   Physical Exam  Constitutional: She is oriented to person, place, and time. She appears well-developed and well-nourished. No distress.  HENT:  Head: Normocephalic and atraumatic.  Eyes: Conjunctivae and EOM are normal.  Neck: Neck supple. No tracheal deviation present.  Cardiovascular: Normal rate.  Pulmonary/Chest: Effort normal. No respiratory distress.  Musculoskeletal: Normal range of motion.  Neurological: She is alert and oriented to person, place, and time.  Skin: Skin is warm and dry.  Psychiatric: She has a normal mood and affect. Her behavior is normal.  Nursing note and vitals reviewed.  BP 128/80   Pulse 74   Temp 98.5 F (36.9 C)   Resp 16   Ht 5\' 1"  (1.549 m)   Wt 139 lb 6.4 oz (63.2 kg)   SpO2 96%   BMI 26.34 kg/m     Dressing and iodoform gauze removed.  Abscess bed flushed with sterile saline.  Very minimal purulent drainage seen.  Did not repack as wound as has been 9 days from I&D and patient is leaving town.  Rec warm wet compresses over dressing several times daily.   Remove wound dressing before shower and wash incision with soap and water.Can continue to apply mupirocin and bandage after each shower. RTC immed for any increased pain, redness, swelling, or purulent drainage.  Assessment & Plan:   1. Infected sebaceous cyst - cont doxycycline until incision fully healed since pt leaving town to visit family and not be avail to f/u here in case of recurrence.  See AVS for details on wound care  2. Cyst of right ovary   3. Abdominal pain, chronic, right lower quadrant     Reviewed results of pelvic US - Rt ovarian cyst improving - smaller - so cont watchful waiting.  No postmenopausal bleeding and pelvic pain symptoms have significantly improved.  Will recheck ultrasound in another 6 months to 1 year  Meds ordered this encounter  Medications  . doxycycline  (VIBRA-TABS) 100 MG tablet    Sig: Take 1 tablet (100 mg total) by mouth 2 (two) times daily.    Dispense:  14 tablet    Refill:  0  . mupirocin ointment (BACTROBAN) 2 %    Sig: Apply 1 application topically 2 (two) times daily. Can use up to  four times daily if needed    Dispense:  30 g    Refill:  1  . triamcinolone cream (KENALOG) 0.1 %    Sig: Apply 1 application topically 2 (two) times daily.    Dispense:  80 g    Refill:  1    Delman Cheadle, MD, MPH Primary Care at [place], [place]  [phone] Office phone  [phone] Office fax   01/29/18 8:22 AM

## 2017-09-08 ENCOUNTER — Encounter: Payer: Self-pay | Admitting: Family Medicine

## 2017-09-21 ENCOUNTER — Telehealth: Payer: Self-pay

## 2017-09-21 NOTE — Telephone Encounter (Signed)
Copied from [place] [id number]. Topic: General - Other >> Sep 21, 2017  9:50 AM Carolyn Stare wrote:  Pt said she had a [place] on 08/31/17 and wastold there was gas in the bowel and she is calling today to ask if something can be called in for her to help relieve some of the [place]     Discussed with Dr. Brigitte Pulse.  She suggests eat less fresh veges,  Get GAS-X over the counter and start an over the counter probiotic.   Called and discussed above with pt.  She states she already takes a probiotic and will adhere to above instructions.

## 2018-01-02 ENCOUNTER — Encounter: Payer: Self-pay | Admitting: Family Medicine

## 2018-01-10 IMAGING — DX DG SHOULDER 2+V*R*
3 series · 3 of 3 positions shown · non-contrast
Comparison: None.

CLINICAL DATA: Right shoulder pain.  No known injury.

EXAM:
RIGHT SHOULDER - 2+ VIEW

[shoulder ap]
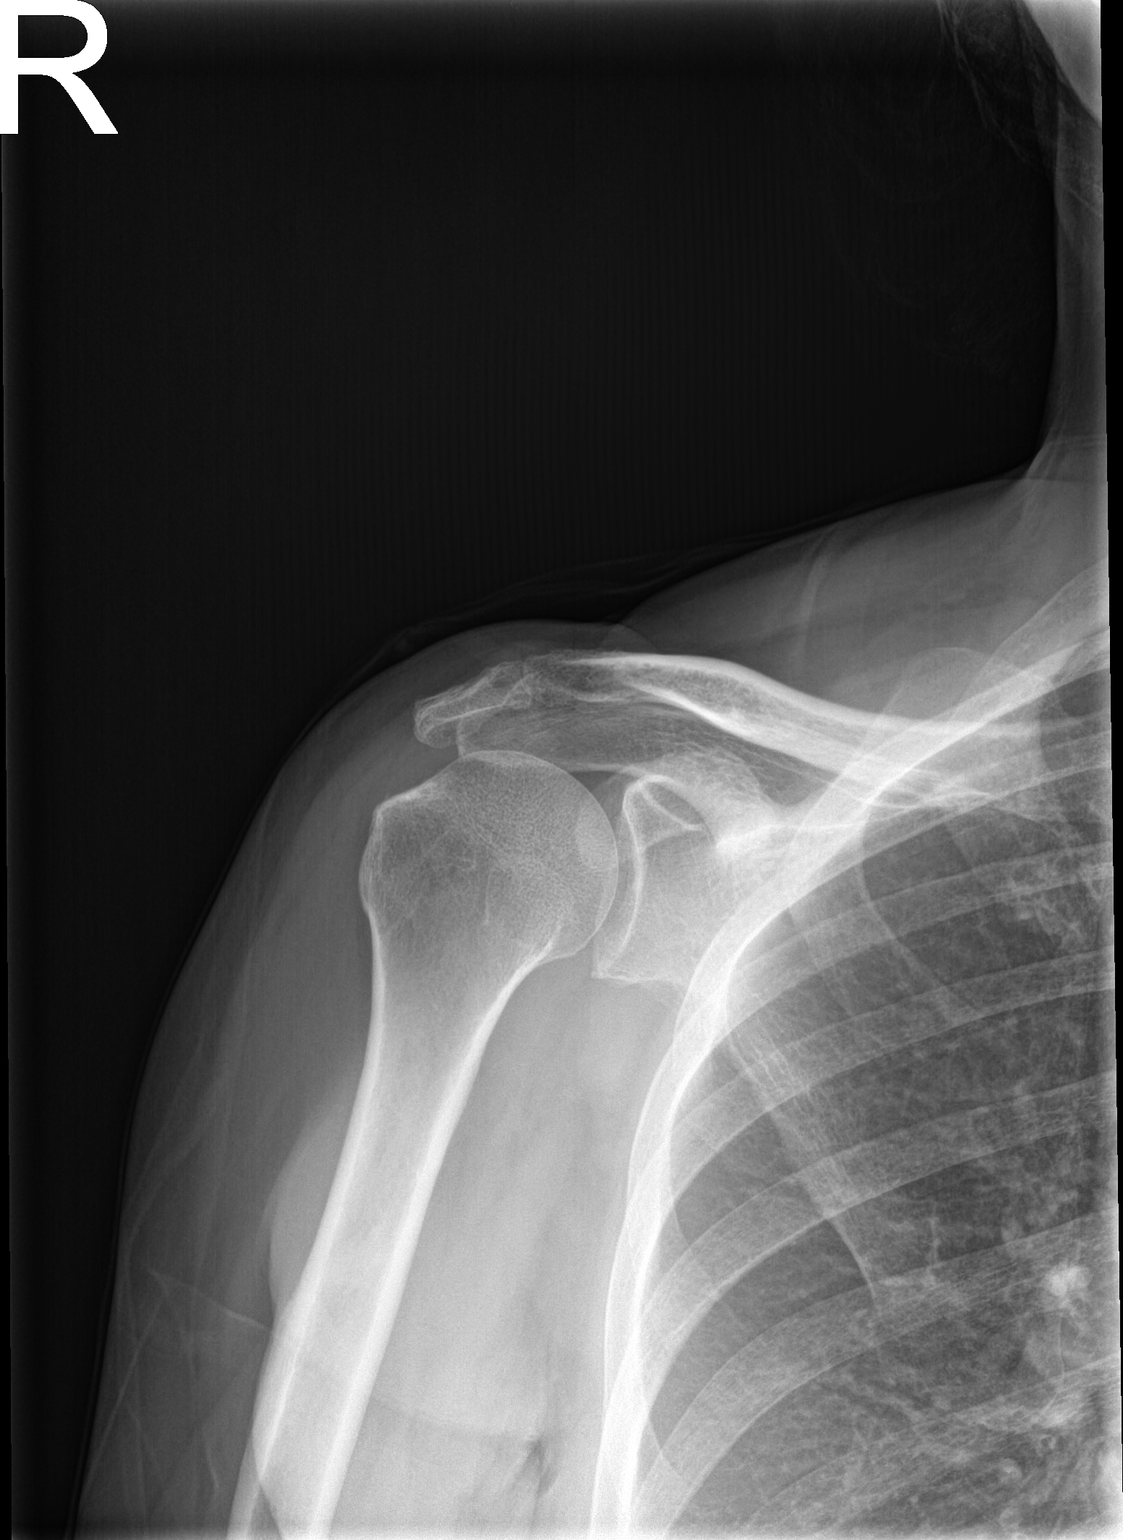

[shoulder y-view]
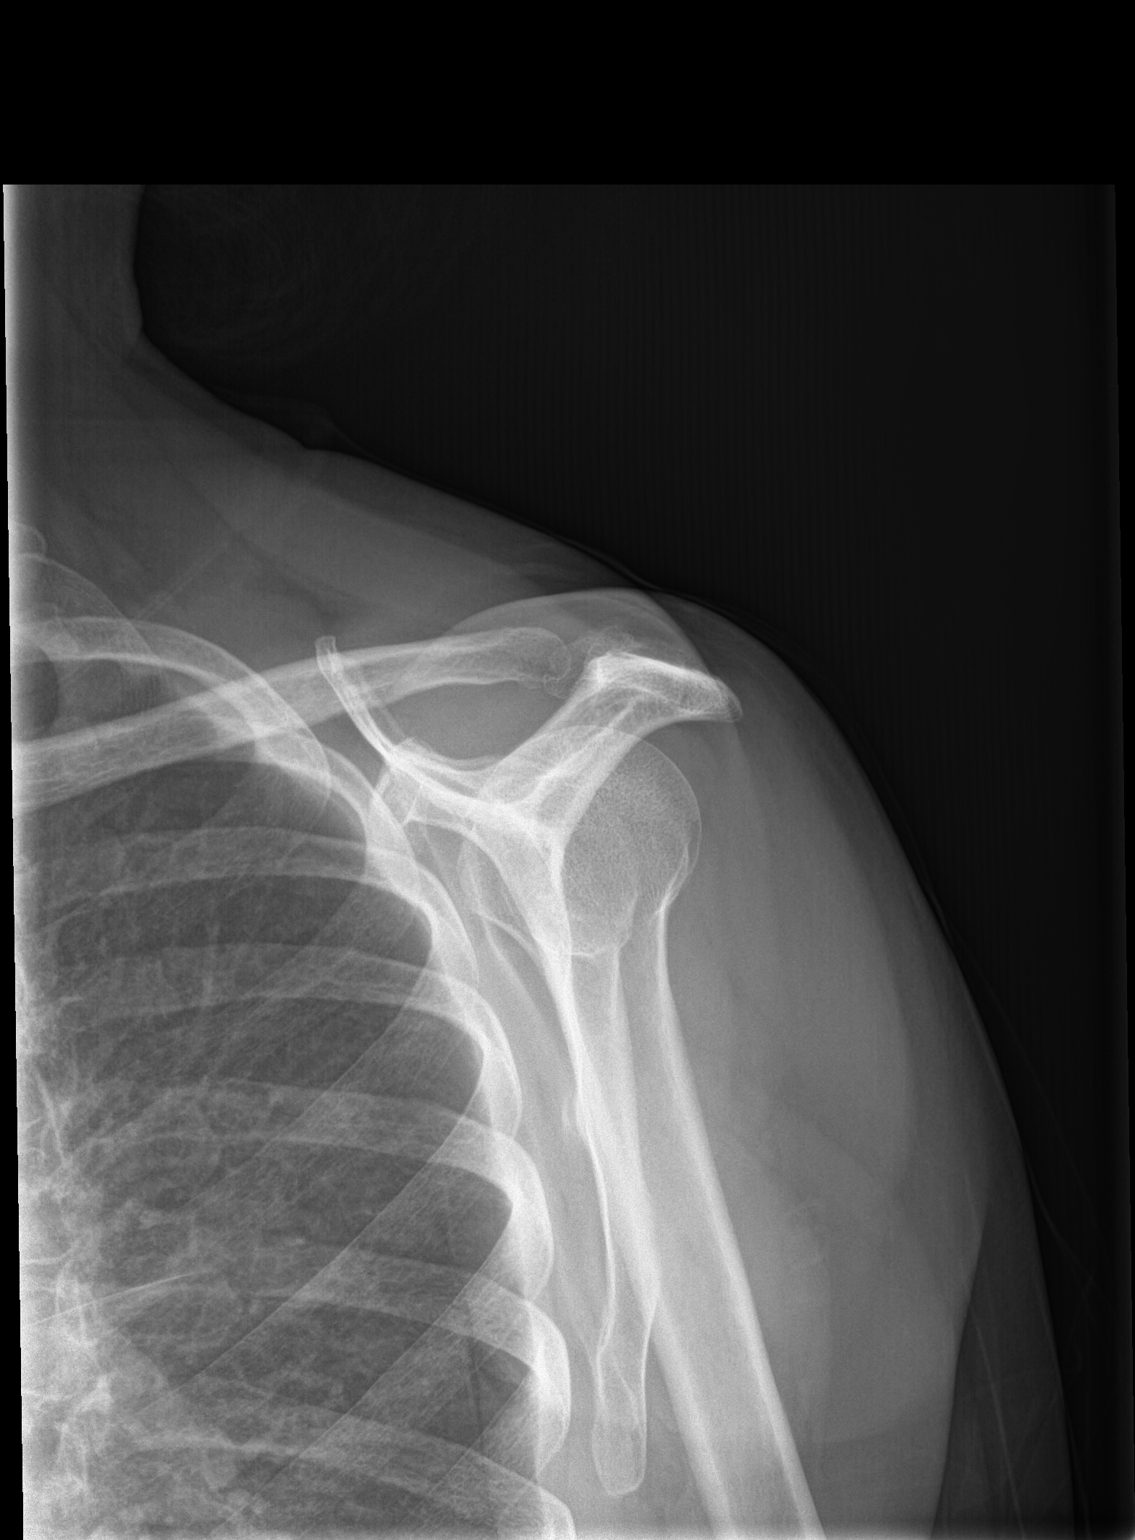

[shoulder axial]
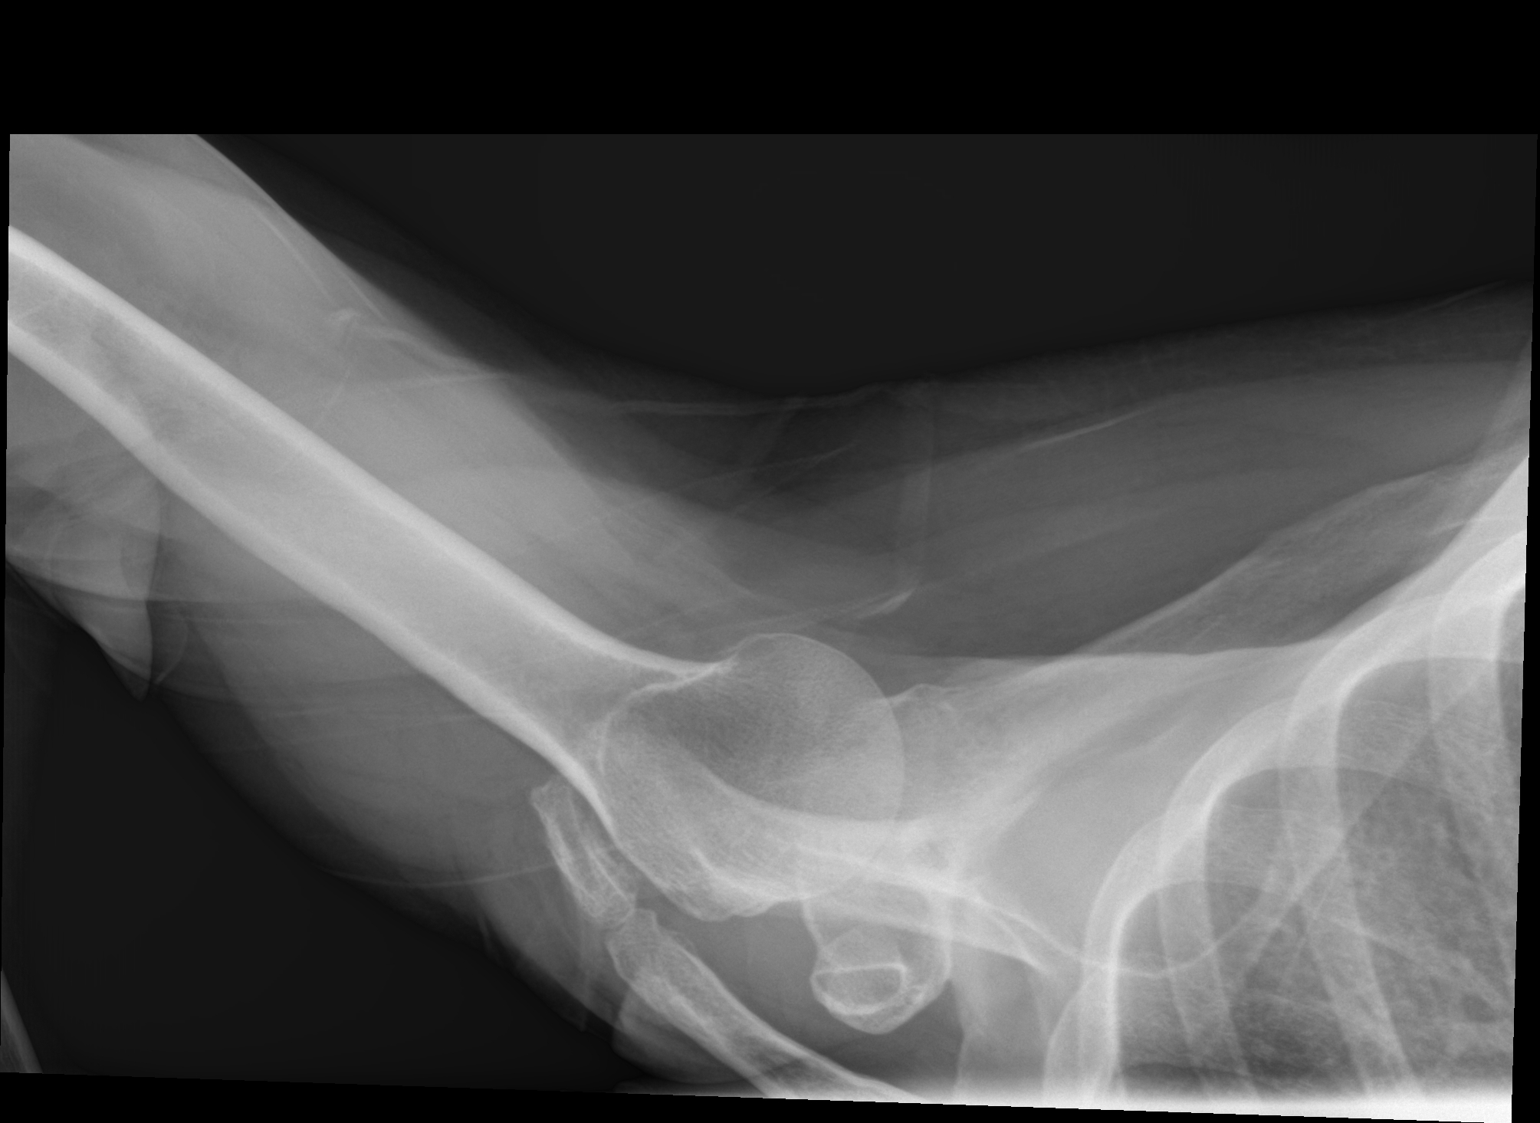

[3 of 3 positions shown; findings below may reference images not displayed]

FINDINGS: No acute bony or joint abnormality is identified. Mild
acromioclavicular osteoarthritis is seen. Image right lung and ribs
are unremarkable.
IMPRESSION: No acute finding.

Mild acromioclavicular osteoarthritis.

## 2018-01-15 DIAGNOSIS — H2513 Age-related nuclear cataract, bilateral: Secondary | ICD-10-CM | POA: Diagnosis not present

## 2018-01-15 DIAGNOSIS — H10413 Chronic giant papillary conjunctivitis, bilateral: Secondary | ICD-10-CM | POA: Diagnosis not present

## 2018-01-15 DIAGNOSIS — H43813 Vitreous degeneration, bilateral: Secondary | ICD-10-CM | POA: Diagnosis not present

## 2018-01-16 DIAGNOSIS — Z1231 Encounter for screening mammogram for malignant neoplasm of breast: Secondary | ICD-10-CM | POA: Diagnosis not present

## 2018-01-16 DIAGNOSIS — M79676 Pain in unspecified toe(s): Secondary | ICD-10-CM

## 2018-01-17 LAB — HM MAMMOGRAPHY

## 2018-03-15 ENCOUNTER — Encounter: Payer: Self-pay | Admitting: Family Medicine

## 2018-03-15 ENCOUNTER — Ambulatory Visit (INDEPENDENT_AMBULATORY_CARE_PROVIDER_SITE_OTHER): Payer: PPO | Admitting: Family Medicine

## 2018-03-15 ENCOUNTER — Other Ambulatory Visit: Payer: Self-pay

## 2018-03-15 VITALS — BP 130/80 | HR 74 | Temp 98.0°F | Resp 16 | Ht 60.04 in | Wt 140.0 lb

## 2018-03-15 DIAGNOSIS — E785 Hyperlipidemia, unspecified: Secondary | ICD-10-CM | POA: Diagnosis not present

## 2018-03-15 DIAGNOSIS — R350 Frequency of micturition: Secondary | ICD-10-CM

## 2018-03-15 DIAGNOSIS — R140 Abdominal distension (gaseous): Secondary | ICD-10-CM | POA: Diagnosis not present

## 2018-03-15 DIAGNOSIS — I10 Essential (primary) hypertension: Secondary | ICD-10-CM | POA: Diagnosis not present

## 2018-03-15 DIAGNOSIS — R142 Eructation: Secondary | ICD-10-CM | POA: Diagnosis not present

## 2018-03-15 DIAGNOSIS — H9190 Unspecified hearing loss, unspecified ear: Secondary | ICD-10-CM | POA: Diagnosis not present

## 2018-03-15 DIAGNOSIS — R196 Halitosis: Secondary | ICD-10-CM

## 2018-03-15 DIAGNOSIS — Z0001 Encounter for general adult medical examination with abnormal findings: Secondary | ICD-10-CM

## 2018-03-15 DIAGNOSIS — Z0000 Encounter for general adult medical examination without abnormal findings: Secondary | ICD-10-CM

## 2018-03-15 DIAGNOSIS — K219 Gastro-esophageal reflux disease without esophagitis: Secondary | ICD-10-CM

## 2018-03-15 DIAGNOSIS — E2839 Other primary ovarian failure: Secondary | ICD-10-CM

## 2018-03-15 LAB — POC MICROSCOPIC URINALYSIS (UMFC): MUCUS RE: ABSENT

## 2018-03-15 LAB — POCT URINALYSIS DIP (MANUAL ENTRY)
Bilirubin, UA: NEGATIVE
Blood, UA: NEGATIVE
Glucose, UA: NEGATIVE mg/dL
Ketones, POC UA: NEGATIVE mg/dL
LEUKOCYTES UA: NEGATIVE
NITRITE UA: NEGATIVE
PH UA: 5.5 (ref 5.0–8.0)
PROTEIN UA: NEGATIVE mg/dL
Spec Grav, UA: 1.025 (ref 1.010–1.025)
UROBILINOGEN UA: 0.2 U/dL

## 2018-03-15 MED ORDER — LISINOPRIL 5 MG PO TABS: 5.0000 mg | ORAL_TABLET | Freq: Every day | ORAL | 3 refills | Status: DC

## 2018-03-15 MED ORDER — OMEPRAZOLE 40 MG PO CPDR: 40.0000 mg | DELAYED_RELEASE_CAPSULE | Freq: Every day | ORAL | 1 refills | Status: DC

## 2018-03-15 MED ORDER — PRAVASTATIN SODIUM 40 MG PO TABS: ORAL_TABLET | ORAL | 3 refills | Status: DC

## 2018-03-15 MED ORDER — TRIAMCINOLONE ACETONIDE 0.1 % EX CREA: 1.0000 "application " | TOPICAL_CREAM | Freq: Two times a day (BID) | CUTANEOUS | 1 refills | Status: AC

## 2018-03-15 NOTE — Patient Instructions (Addendum)
If you have lab work done today you will be contacted with your lab results within the next 2 weeks.  If you have not heard from [place] then please contact us. The fastest way to get your results is to register for My Chart.   IF you received an x-ray today, you will receive an invoice from [facility]. Please contact [facility] at [phone] with questions or concerns regarding your invoice.   IF you received labwork today, you will receive an invoice from [facility]. Please contact [facility] at [phone] with questions or concerns regarding your invoice.   Our billing staff will not be able to assist you with questions regarding bills from these companies.  You will be contacted with the lab results as soon as they are available. The fastest way to get your results is to activate your My Chart account. Instructions are located on the last page of this paperwork. If you have not heard from [place] regarding the results in 2 weeks, please contact this office.      Ms. Rhonda Burns , Thank you for taking time to come for your Medicare Wellness Visit. I appreciate your ongoing commitment to your health goals. Please review the following plan we discussed and let me know if I can assist you in the future.   These are the goals we discussed: Goals    . Exercise 3x per week (30 min per time)     Patient states that she wants to try to start going to the gym and doing more exercises to work on her core.        This is a list of the screening recommended for you and due dates:  Health Maintenance  Topic Date Due  . Flu Shot  04/15/2019*  . Colon Cancer Screening  07/25/2018  . Mammogram  01/18/2020  . Tetanus Vaccine  01/03/2028  . DEXA scan (bone density measurement)  Completed  .  Hepatitis C: One time screening is recommended by Center for Disease Control  (CDC) for  adults born from [birth year] through 1965.   Completed  *Topic was postponed. The date shown is not the original due  date.   Calcium Intake Recommendations Calcium is a mineral that affects many functions in the body, including:  Blood clotting.  Blood vessel function.  Nerve impulse conduction.  Hormone secretion.  Muscle contraction.  Bone and teeth functions.  Most of your body's calcium supply is stored in your bones and teeth. When your calcium stores are low, you may be at risk for low bone mass, bone loss, and bone fractures. Consuming enough calcium helps to grow healthy bones and teeth and to prevent breakdown over time. It is very important that you get enough calcium if you are:  A child undergoing rapid growth.  An adolescent girl.  A pre- or post-menopausal woman.  A woman whose menstrual cycle has stopped due to anorexia nervosa or regular intense exercise.  An individual with lactose intolerance or a milk allergy.  A vegetarian.  What is my plan? Try to consume the recommended amount of calcium daily based on your age. Depending on your overall health, your health care provider may recommend increased calcium intake.General daily calcium intake recommendations by age are:  Birth to 6 months: 200 mg.  Infants 7 to 12 months: 260 mg.  Children 1 to 3 years: 700 mg.  Children 4 to 8 years: 1,000 mg.  Children 9 to 13 years: 1,300 mg.  Teens 14 to 18 years: 1,300 mg.  Adults 19 to 50 years: 1,000 mg.  Adult women 51 to 70 years: 1,200 mg.  Adult men 51 to 70 years: 1,000 mg.  Adults 71 years and older: 1,200 mg.  Pregnant and breastfeeding teens: 1,300 mg.  Pregnant and breastfeeding adults: 1,000 mg.  What do I need to know about calcium intake?  In order for the body to absorb calcium, it needs vitamin D. You can get vitamin D through: ? Direct exposure of the skin to sunlight. ? Foods, such as egg yolks, liver, saltwater fish, and fortified milk. ? Supplements.  Consuming too much calcium may cause: ? Constipation. ? Decreased absorption of iron and  zinc. ? Kidney stones.  Calcium supplements may interact with certain medicines. Check with your health care provider before starting any calcium supplements.  Try to get most of your calcium from food. What foods can I eat? Grains  Fortified oatmeal. Fortified ready-to-eat cereals. Fortified frozen waffles. Vegetables Turnip greens. Broccoli. Fruits Fortified orange juice. Meats and Other Protein Sources Canned sardines with bones. Canned salmon with bones. Soy beans. Tofu. Baked beans. Almonds. [place] nuts. Sunflower seeds. Dairy Milk. Yogurt. Cheese. Cottage cheese. Beverages Fortified soy milk. Fortified rice milk. Sweets/Desserts Pudding. Ice Cream. Milkshakes. Blackstrap molasses. The items listed above may not be a complete list of recommended foods or beverages. Contact your dietitian for more options. What foods can affect my calcium intake? It may be more difficult for your body to use calcium or calcium may leave your body more quickly if you consume large amounts of:  Sodium.  Protein.  Caffeine.  Alcohol.  This information is not intended to replace advice given to you by your health care provider. Make sure you discuss any questions you have with your health care provider. Document Released: 11/24/2003 Document Revised: 10/30/2015 Document Reviewed: 09/17/2013 Elsevier Interactive Patient Education  2018 [employer].

## 2018-03-15 NOTE — Progress Notes (Signed)
Subjective:    Patient ID: Rhonda Burns; female   DOB: 1946-02-06; 72 y.o.   MRN: [id number]  Chief Complaint  Patient presents with  . Annual Exam    HPI Primary Preventative Screenings: Cervical Cancer: NA due to age. STI screening: neg hep C 2014 Breast Cancer: 01/18/18 Colorectal Cancer: Done 07/24/08 nml so due to repeat in the next 5 mos - was done at [facility] by Dr. Michail Sermon Tobacco use/EtOH/substances: Bone Density: normal 03/24/2015 - will repeat next year 12/2018 with mammogram Cardiac: Weight/Blood sugar/Diet/Exercise: BMI Readings from Last 3 Encounters:  09/02/17 26.34 kg/m  08/31/17 26.19 kg/m  08/28/17 26.19 kg/m   No results found for: HGBA1C OTC/Vit/Supp/Herbal: ca gummy chewable 1 twice a day w/ vit D 2000u/d. mvi gummy occ. Asa 325 qd - was rx'd this yrs ago by a doctor for blood thinning. Prn tylenol. Fish oil qd.  Dentist/Optho:Dr. Katy Fitch ophtho - seen in Sept and dentist 2x/yr Immunizations:  Immunization History  Administered Date(s) Administered  . PPD Test 02/22/2005  . Pneumococcal Conjugate-13 03/11/2015  . Pneumococcal Polysaccharide-23 03/10/2009, 03/11/2014  . Td 08/21/1996, 07/04/2006  . Tdap 01/02/2018  . Zoster 09/28/2012     Chronic Medical Conditions: Right overariance syt - decreaseing on prior study - done 6 mos ago - will await another few mos before rechecking again - discuss at wellness exam 1.HPL: on pravastatin 40mg . 2. HTN: On lisinopril 5. Usually 329/924- systolic. Does have BP cuff but does not check at home.  3. H/o Osteoporosis:now off fosamax for 6-7 years and then last DEXA bone scan 12/3014 was normal - will recheck 12/2018.06/3011 vit D nml at 69  4. Chronic abdominal pain: Dr. Havery Moros reported last yr after upper and lower endoscopy that: "gastric biopsies were negative, no evidence of H pylori. Her MRCP and EGD have not yielded a cause for her pain, which I suspect is due to a musculoskeletal etiology. If her pain  persists please book her a follow up clinic appointment so we can discuss potential treatment options moving forward."  Lots of gas in stomach: Trying gas-x and priobiotics.   Ears:  Will refer to [place] changed: Seeing Dr. Katy Fitch last Sept 2019 2 oms ago - he noted it changed slightly in her left eye.  But Dr. Gershon Crane told her 7 yrs ago that she had growing cataracts but not yet to the points that she needs to do something about them. Increased urination  Anxiety: About her daughter not doing well in school  Medical History: Past Medical History:  Diagnosis Date  . Abdominal pain, chronic, right upper quadrant   . Allergic rhinitis, cause unspecified   . Allergy   . Asthma   . Cataract    growing cataracts  . GERD (gastroesophageal reflux disease)   . Hiatal hernia   . Hyperlipidemia   . Hypertension   . Osteoporosis   . Osteoporosis   . Schatzki's ring    Past Surgical History:  Procedure Laterality Date  . CESAREAN SECTION    . COLONOSCOPY     Current Outpatient Medications on File Prior to Visit  Medication Sig Dispense Refill  . acetaminophen (TYLENOL) 500 MG tablet Take 500 mg by mouth every 8 (eight) hours as needed.    Marland Kitchen albuterol (VENTOLIN HFA) 108 (90 Base) MCG/ACT inhaler Inhale 2 puffs into the lungs every 6 (six) hours as needed for wheezing. 3 Inhaler 0  . aspirin 325 MG tablet Take 325 mg by mouth daily.    Marland Kitchen  calcium gluconate 500 MG tablet Take 500 mg by mouth daily.    . Cholecalciferol (D3 SUPER STRENGTH) 2000 UNITS CAPS Take by mouth once.    . fluticasone (FLONASE) 50 MCG/ACT nasal spray Place 2 sprays into both nostrils daily as needed for allergies. 16 g 5  . lisinopril (PRINIVIL,ZESTRIL) 5 MG tablet Take 1 tablet (5 mg total) daily by mouth. 90 tablet 3  . mupirocin ointment (BACTROBAN) 2 % Apply 1 application topically 2 (two) times daily. Can use up to four times daily if needed 30 g 1  . pravastatin (PRAVACHOL) 40 MG tablet TAKE 1 TABLET DAILY. 90  tablet 3  . triamcinolone cream (KENALOG) 0.1 % Apply 1 application topically 2 (two) times daily. 80 g 1   No current facility-administered medications on file prior to visit.    Allergies  Allergen Reactions  . Other Swelling    Pecans and coconut   Family History  Problem Relation Age of Onset  . Alzheimer's disease Mother   . Heart disease Mother   . Stroke Mother   . Asthma Sister   . Heart disease Father   . Heart attack Father   . Diabetes Father   . Colon cancer Neg Hx   . Esophageal cancer Neg Hx    Social History   Socioeconomic History  . Marital status: Widowed    Spouse name: Not on file  . Number of children: 1  . Years of education: 14  . Highest education level: Some college, no degree  Occupational History  . Occupation: retired  Scientific laboratory technician  . Financial resource strain: Not hard at all  . Food insecurity:    Worry: Never true    Inability: Never true  . Transportation needs:    Medical: No    Non-medical: No  Tobacco Use  . Smoking status: Former Research scientist (life sciences)  . Smokeless tobacco: Never Used  Substance and Sexual Activity  . Alcohol use: No    Alcohol/week: 0.0 standard drinks  . Drug use: No  . Sexual activity: Not on file  Lifestyle  . Physical activity:    Days per week: 7 days    Minutes per session: 120 min  . Stress: Not at all  Relationships  . Social connections:    Talks on phone: More than three times a week    Gets together: More than three times a week    Attends religious service: More than 4 times per year    Active member of club or organization: Yes    Attends meetings of clubs or organizations: More than 4 times per year    Relationship status: Widowed  Other Topics Concern  . Not on file  Social History Narrative   Exercise walking 7 days per week for 1 hour   Depression screen [facility] 2/9 03/15/2018 09/02/2017 09/02/2017 08/31/2017 08/26/2017  Decreased Interest 0 0 0 0 0  Down, Depressed, Hopeless 0 0 0 0 0  PHQ - 2 Score 0 0  0 0 0     ROS Otherwise as noted in HPI.  Objective:  There were no vitals taken for this visit.  Visual Acuity Screening   Right eye Left eye Both eyes  Without correction:     With correction: 20/20 20/20 20/20    Physical Exam  Constitutional: She is oriented to person, place, and time. She appears well-developed and well-nourished. No distress.  HENT:  Head: Normocephalic and atraumatic.  Right Ear: Tympanic membrane, external ear and ear  canal normal.  Left Ear: Tympanic membrane, external ear and ear canal normal.  Nose: Nose normal. No mucosal edema or rhinorrhea.  Mouth/Throat: Uvula is midline, oropharynx is clear and moist and mucous membranes are normal. No posterior oropharyngeal erythema.  Eyes: Pupils are equal, round, and reactive to light. Conjunctivae and EOM are normal. Right eye exhibits no discharge. Left eye exhibits no discharge. No scleral icterus.  Neck: Normal range of motion. Neck supple. No thyromegaly present.  Cardiovascular: Normal rate, regular rhythm, normal heart sounds and intact distal pulses.  Pulmonary/Chest: Effort normal and breath sounds normal. No respiratory distress.  Abdominal: Soft. Bowel sounds are normal. There is no tenderness.  Musculoskeletal: She exhibits no edema.  Lymphadenopathy:    She has no cervical adenopathy.  Neurological: She is alert and oriented to person, place, and time. She has normal reflexes.  Skin: Skin is warm and dry. She is not diaphoretic. No erythema.  Psychiatric: She has a normal mood and affect. Her behavior is normal.   [facility] Office Visit on 03/15/2018  Component Date Value Ref Range Status  . Color, UA 03/15/2018 yellow  yellow Final  . Clarity, UA 03/15/2018 clear  clear Final  . Glucose, UA 03/15/2018 negative  negative mg/dL Final  . Bilirubin, UA 03/15/2018 negative  negative Final  . Ketones, POC UA 03/15/2018 negative  negative mg/dL Final  . Spec Grav, UA 03/15/2018 1.025  1.010 -  1.025 Final  . Blood, UA 03/15/2018 negative  negative Final  . pH, UA 03/15/2018 5.5  5.0 - 8.0 Final  . Protein Ur, POC 03/15/2018 negative  negative mg/dL Final  . Urobilinogen, UA 03/15/2018 0.2  0.2 or 1.0 E.U./dL Final  . Nitrite, UA 03/15/2018 Negative  Negative Final  . Leukocytes, UA 03/15/2018 Negative  Negative Final  . WBC,UR,HPF,POC 03/15/2018 None  None WBC/hpf Final  . RBC,UR,HPF,POC 03/15/2018 None  None RBC/hpf Final  . Bacteria 03/15/2018 None  None, Too numerous to count Final  . Mucus 03/15/2018 Absent  Absent Final  . Epithelial Cells, UR Per Microscopy 03/15/2018 Few* None, Too numerous to count cells/hpf Final  . Urine Culture, Routine 03/15/2018 Final report   Final  . Organism ID, Bacteria 03/15/2018 No growth   Final  . Glucose 03/15/2018 101* 65 - 99 mg/dL Final  . BUN 03/15/2018 16  8 - 27 mg/dL Final  . Creatinine, Ser 03/15/2018 0.84  0.57 - 1.00 mg/dL Final  . GFR calc non Af Amer 03/15/2018 70  >59 mL/min/1.73 Final  . GFR calc Af Amer 03/15/2018 80  >59 mL/min/1.73 Final  . BUN/Creatinine Ratio 03/15/2018 19  12 - 28 Final  . Sodium 03/15/2018 144  134 - 144 mmol/L Final  . Potassium 03/15/2018 4.1  3.5 - 5.2 mmol/L Final  . Chloride 03/15/2018 105  96 - 106 mmol/L Final  . CO2 03/15/2018 25  20 - 29 mmol/L Final  . Calcium 03/15/2018 9.9  8.7 - 10.3 mg/dL Final  . Total Protein 03/15/2018 7.2  6.0 - 8.5 g/dL Final  . Albumin 03/15/2018 4.5  3.5 - 4.8 g/dL Final  . Globulin, Total 03/15/2018 2.7  1.5 - 4.5 g/dL Final  . Albumin/Globulin Ratio 03/15/2018 1.7  1.2 - 2.2 Final  . Bilirubin Total 03/15/2018 0.6  0.0 - 1.2 mg/dL Final  . Alkaline Phosphatase 03/15/2018 81  39 - 117 IU/L Final  . AST 03/15/2018 23  0 - 40 IU/L Final  . ALT 03/15/2018 18  0 - 32  IU/L Final  . Cholesterol, Total 03/15/2018 141  100 - 199 mg/dL Final  . Triglycerides 03/15/2018 100  0 - 149 mg/dL Final  . HDL 03/15/2018 53  >39 mg/dL Final  . VLDL Cholesterol Cal 03/15/2018  20  5 - 40 mg/dL Final  . LDL Calculated 03/15/2018 68  0 - 99 mg/dL Final  . Chol/HDL Ratio 03/15/2018 2.7  0.0 - 4.4 ratio Final   Comment:                                   T. Chol/HDL Ratio                                             Men  Women                               1/2 Avg.Risk  3.4    3.3                                   Avg.Risk  5.0    4.4                                2X Avg.Risk  9.6    7.1                                3X Avg.Risk 23.4   11.0   . TSH 03/15/2018 1.370  0.450 - 4.500 uIU/mL Final  . WBC 03/15/2018 5.4  3.4 - 10.8 x10E3/uL Final  . RBC 03/15/2018 4.23  3.77 - 5.28 x10E6/uL Final  . Hemoglobin 03/15/2018 12.2  11.1 - 15.9 g/dL Final  . Hematocrit 03/15/2018 36.6  34.0 - 46.6 % Final  . MCV 03/15/2018 87  79 - 97 fL Final  . MCH 03/15/2018 28.8  26.6 - 33.0 pg Final  . MCHC 03/15/2018 33.3  31.5 - 35.7 g/dL Final  . RDW 03/15/2018 12.2* 12.3 - 15.4 % Final  . Platelets 03/15/2018 308  150 - 450 x10E3/uL Final     Assessment & Plan:   1. Annual physical exam   2. Estrogen deficiency   3. Urinary frequency   4. Hyperlipidemia, unspecified hyperlipidemia type   5. Essential hypertension   6. Gastroesophageal reflux disease, esophagitis presence not specified   7. Halitosis   8. Belching   9. Abdominal bloating   10. Hearing loss, unspecified hearing loss type, unspecified laterality    Patient will continue on current chronic medications other than changes noted above, so ok to refill when needed.   Reviewed all health maintenance recommendations per USPSTF guidelines.   See after visit summary for patient specific instructions.  Orders Placed This Encounter  Procedures  . Urine Culture  . DG Bone Density    Standing Status:   Future    Standing Expiration Date:   05/16/2019    Order Specific Question:   Reason for Exam (SYMPTOM  OR DIAGNOSIS REQUIRED)    Answer:   estrogen deficiency    Order Specific Question:   Preferred  imaging  location?    Answer:   External    Comments:   [facility]  . Comprehensive metabolic panel    Order Specific Question:   Has the patient fasted?    Answer:   Yes  . Lipid panel    Order Specific Question:   Has the patient fasted?    Answer:   Yes  . TSH  . CBC  . Ambulatory referral to Audiology    Referral Priority:   Routine    Referral Type:   Audiology Exam    Referral Reason:   Specialty Services Required    Number of Visits Requested:   1  . POCT urinalysis dipstick  . POCT Microscopic Urinalysis ([facility])    Meds ordered this encounter  Medications  . omeprazole (PRILOSEC) 40 MG capsule    Sig: Take 1 capsule (40 mg total) by mouth daily. 30 minutes before a meal    Dispense:  30 capsule    Refill:  1  . triamcinolone cream (KENALOG) 0.1 %    Sig: Apply 1 application topically 2 (two) times daily.    Dispense:  80 g    Refill:  1  . pravastatin (PRAVACHOL) 40 MG tablet    Sig: TAKE 1 TABLET DAILY.    Dispense:  90 tablet    Refill:  3  . lisinopril (PRINIVIL,ZESTRIL) 5 MG tablet    Sig: Take 1 tablet (5 mg total) by mouth daily.    Dispense:  90 tablet    Refill:  3    Patient verbalized to me that they understand the following: diagnosis, what is being done for them, what to expect and what should be done at home.  Their questions have been answered. They understand that I am unable to predict every possible medication interaction or adverse outcome and that if any unexpected symptoms arise, they should contact us and their pharmacist, as well as never hesitate to seek urgent/emergent care at [facility] or ER if they think it might be warranted.    Delman Cheadle, MD, MPH [facility] [address] [place], [place]  [phone] Office phone  [phone] Office fax   03/15/18 8:33 AM

## 2018-03-16 LAB — CBC
HEMATOCRIT: 36.6 % (ref 34.0–46.6)
HEMOGLOBIN: 12.2 g/dL (ref 11.1–15.9)
MCH: 28.8 pg (ref 26.6–33.0)
MCHC: 33.3 g/dL (ref 31.5–35.7)
MCV: 87 fL (ref 79–97)
Platelets: 308 10*3/uL (ref 150–450)
RBC: 4.23 x10E6/uL (ref 3.77–5.28)
RDW: 12.2 % — ABNORMAL LOW (ref 12.3–15.4)
WBC: 5.4 10*3/uL (ref 3.4–10.8)

## 2018-03-16 LAB — COMPREHENSIVE METABOLIC PANEL
A/G RATIO: 1.7 (ref 1.2–2.2)
ALBUMIN: 4.5 g/dL (ref 3.5–4.8)
ALK PHOS: 81 IU/L (ref 39–117)
ALT: 18 IU/L (ref 0–32)
AST: 23 IU/L (ref 0–40)
BILIRUBIN TOTAL: 0.6 mg/dL (ref 0.0–1.2)
BUN / CREAT RATIO: 19 (ref 12–28)
BUN: 16 mg/dL (ref 8–27)
CHLORIDE: 105 mmol/L (ref 96–106)
CO2: 25 mmol/L (ref 20–29)
Calcium: 9.9 mg/dL (ref 8.7–10.3)
Creatinine, Ser: 0.84 mg/dL (ref 0.57–1.00)
GFR calc Af Amer: 80 mL/min/{1.73_m2} (ref >59)
GFR calc non Af Amer: 70 mL/min/{1.73_m2} (ref >59)
GLUCOSE: 101 mg/dL — AB (ref 65–99)
Globulin, Total: 2.7 g/dL (ref 1.5–4.5)
POTASSIUM: 4.1 mmol/L (ref 3.5–5.2)
SODIUM: 144 mmol/L (ref 134–144)
Total Protein: 7.2 g/dL (ref 6.0–8.5)

## 2018-03-16 LAB — LIPID PANEL
Chol/HDL Ratio: 2.7 ratio (ref 0.0–4.4)
Cholesterol, Total: 141 mg/dL (ref 100–199)
HDL: 53 mg/dL (ref >39)
LDL Calculated: 68 mg/dL (ref 0–99)
Triglycerides: 100 mg/dL (ref 0–149)
VLDL Cholesterol Cal: 20 mg/dL (ref 5–40)

## 2018-03-16 LAB — URINE CULTURE: Organism ID, Bacteria: NO GROWTH

## 2018-03-16 LAB — TSH: TSH: 1.37 u[IU]/mL (ref 0.450–4.500)

## 2018-05-17 ENCOUNTER — Telehealth: Payer: Self-pay | Admitting: Family Medicine

## 2018-05-17 NOTE — Telephone Encounter (Signed)
Dr Shaw please advise. Dgaddy, CMA 

## 2018-05-17 NOTE — Telephone Encounter (Signed)
Copied from [place] [id number]. Topic: Quick Communication - See Telephone Encounter >> May 17, 2018 11:15 AM Antonieta Iba C wrote: CRM for notification. See Telephone encounter for: 05/17/18.  Michelene Heady with [facility] is calling in because they received pt's referral. Pt has medicare so because of that a few things has to be updated on referral for approval.   On the letter head of referral: Pt's name and DOB, reason for referral, Providers Name & Signature, NPI # and date the referral was issued. Once this information is completed /updated please fax to [phone] Phone# if needed is [phone]

## 2018-05-22 ENCOUNTER — Telehealth: Payer: Self-pay | Admitting: Family Medicine

## 2018-05-22 NOTE — Telephone Encounter (Signed)
Please review the following , patient has an appointment for the first part of February and the information in the [facility] is needed   Thank you          Shawnee Knapp, MD  Family Medicine   Referral  Reason for call   Conversation: Referral  (Newest Message First)  Leim Fabry, CMA  to Shawnee Knapp, MD       05/17/18 3:16 PM  Note    Dr Brigitte Pulse please advise. Dgaddy, CMA          05/17/18 11:18 AM  Antonieta Iba C routed this conversation to [place], [place]       05/17/18 11:18 AM  Unsigned Note    Copied from [place] [id number]. Topic: Quick Communication - See Telephone Encounter >> May 17, 2018 11:15 AM Antonieta Iba C wrote: CRM for notification. See Telephone encounter for: 05/17/18.  Michelene Heady with [facility] is calling in because they received pt's referral. Pt has medicare so because of that a few things has to be updated on referral for approval.   On the letter head of referral: Pt's name and DOB, reason for referral, Providers Name & Signature, NPI # and date the referral was issued. Once this information is completed /updated please fax to [phone] Phone# if needed is [phone]           05/17/18 11:15 AM    Cleon Dew contacted Wynetta Emery, [place]   This encounter is not signed. The conversation may still be ongoing.  Additional Documentation   Encounter Info:   Billing Info,   History,   Allergies,   Detailed Report

## 2018-05-23 NOTE — Telephone Encounter (Signed)
Good morning ladies Please read CRM information. This request is something you are able to take care of, there is no extra medical information needed. Since Dr Brigitte Pulse is out of the office, you can use my name and NPI. I can sign the referral letter tomorrow when I am back in clinic. Thank you I Pamella Pert, MD

## 2018-05-25 ENCOUNTER — Telehealth: Payer: Self-pay | Admitting: Family Medicine

## 2018-05-25 NOTE — Telephone Encounter (Signed)
Pt stopped in wanting medicare AWV please call [phone]

## 2018-05-25 NOTE — Telephone Encounter (Signed)
Pt into clinic re referral to Audiology.  Referrals Dept contacted [facility] who will not accept electronic referral already sent.  Requires actual provider signature.  Paper referral written and signed by Dr. Pamella Pert.  Copy faxed.  Original given to pt to take to referral appointment.

## 2018-05-25 NOTE — Telephone Encounter (Signed)
Spoke to [place] and adv her that I gave the information needed to our [place] copy and she was going to have a provider sign the referral and send a paper referral w/the patient. 05/25/2018

## 2018-05-25 NOTE — Telephone Encounter (Signed)
Rhonda Burns called to inform [facility] that they have received the referral.

## 2018-05-29 DIAGNOSIS — H903 Sensorineural hearing loss, bilateral: Secondary | ICD-10-CM | POA: Diagnosis not present

## 2018-06-06 ENCOUNTER — Ambulatory Visit: Payer: Self-pay

## 2018-06-06 DIAGNOSIS — I878 Other specified disorders of veins: Secondary | ICD-10-CM | POA: Diagnosis not present

## 2018-06-06 DIAGNOSIS — M4186 Other forms of scoliosis, lumbar region: Secondary | ICD-10-CM | POA: Diagnosis not present

## 2018-06-06 DIAGNOSIS — M1611 Unilateral primary osteoarthritis, right hip: Secondary | ICD-10-CM | POA: Diagnosis not present

## 2018-06-06 DIAGNOSIS — M160 Bilateral primary osteoarthritis of hip: Secondary | ICD-10-CM | POA: Diagnosis not present

## 2018-06-06 DIAGNOSIS — M533 Sacrococcygeal disorders, not elsewhere classified: Secondary | ICD-10-CM | POA: Diagnosis not present

## 2018-06-06 DIAGNOSIS — M79604 Pain in right leg: Secondary | ICD-10-CM | POA: Diagnosis not present

## 2018-06-06 DIAGNOSIS — M47819 Spondylosis without myelopathy or radiculopathy, site unspecified: Secondary | ICD-10-CM | POA: Diagnosis not present

## 2018-06-06 NOTE — Telephone Encounter (Signed)
Pt called to say that yesterday around 5pm she developed pain to her right thigh it extended into her right knee and then the entire leg.  She rates the pain as severe at 9.  She states that the only way she was able to walk on the leg was to take her tramadol.  She is now walking but with a lump.  She denies injury to the leg.  She denies swelling to the leg. She denies any bug bite area no redness. Per protocol pt will go to urgent care for evaluation of her symptoms. No appointments available. Care advice read to patient. Pt verbalized understanding of all instructions.  Reason for Disposition . [1] SEVERE pain (e.g., excruciating, unable to do any normal activities) AND [2] not improved after 2 hours of pain medicine  Answer Assessment - Initial Assessment Questions 1. ONSET: "When did the pain start?"      yesterday 2. LOCATION: "Where is the pain located?"      Rt leg stated in the thigh and went to the knee and a now entire leg 3. PAIN: "How bad is the pain?"    (Scale 1-10; or mild, moderate, severe)   -  MILD (1-3): doesn't interfere with normal activities    -  MODERATE (4-7): interferes with normal activities (e.g., work or school) or awakens from sleep, limping    -  SEVERE (8-10): excruciating pain, unable to do any normal activities, unable to walk     9 limp 4. WORK OR EXERCISE: "Has there been any recent work or exercise that involved this part of the body?"      no 5. CAUSE: "What do you think is causing the leg pain?"     no 6. OTHER SYMPTOMS: "Do you have any other symptoms?" (e.g., chest pain, back pain, breathing difficulty, swelling, rash, fever, numbness, weakness)     Weakness in rt leg 7. PREGNANCY: "Is there any chance you are pregnant?" "When was your last menstrual period?"   N/A  Protocols used: LEG PAIN-A-AH

## 2018-07-19 IMAGING — US US PELVIS COMPLETE
1 series · 15 of 25 positions shown · non-contrast
Comparison: 03/09/2017

CLINICAL DATA: RIGHT ovarian cysts, chronic RIGHT lower quadrant
abdominal and pelvic pain, increasing pain pressure, and fullness;
postmenopausal

EXAM:
TRANSABDOMINAL AND TRANSVAGINAL ULTRASOUND OF PELVIS
TECHNIQUE: Both transabdominal and transvaginal ultrasound examinations of the
pelvis were performed. Transabdominal technique was performed for
global imaging of the pelvis including uterus, ovaries, adnexal
regions, and pelvic cul-de-sac. It was necessary to proceed with
endovaginal exam following the transabdominal exam to visualize the
endometrium and ovaries.

[Series 1: us pelvis complete · 15 of 44 slices shown]
[im 1/44]
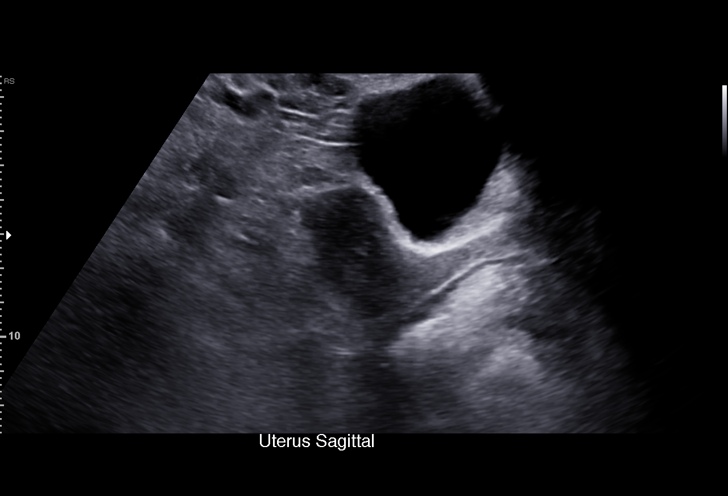
[im 4/44]
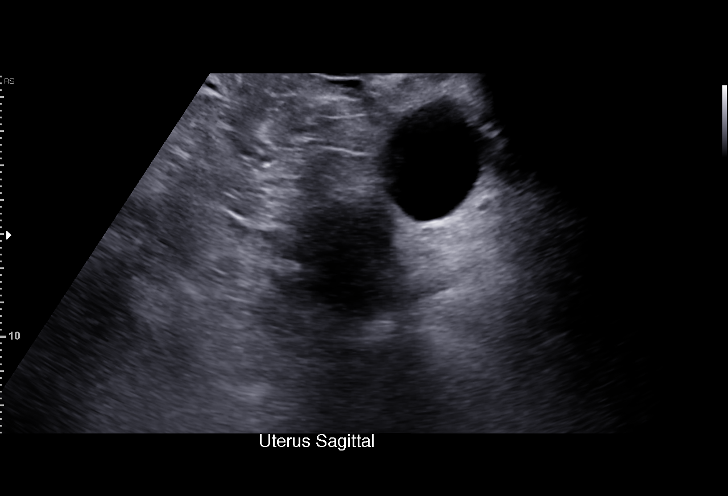
[im 8/44]
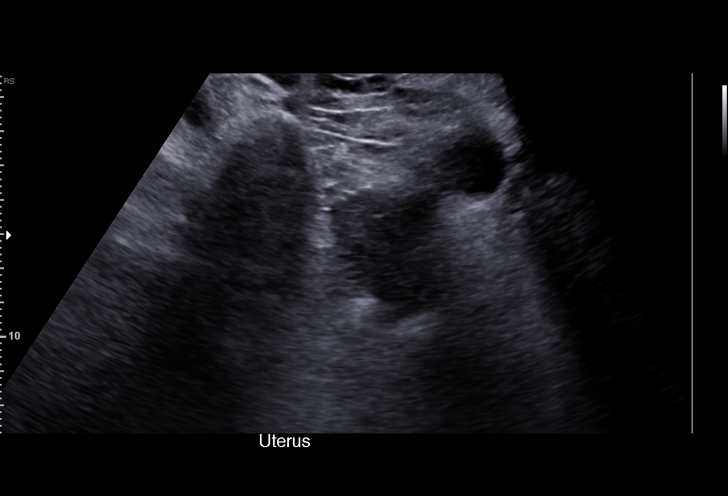
[im 9/44]
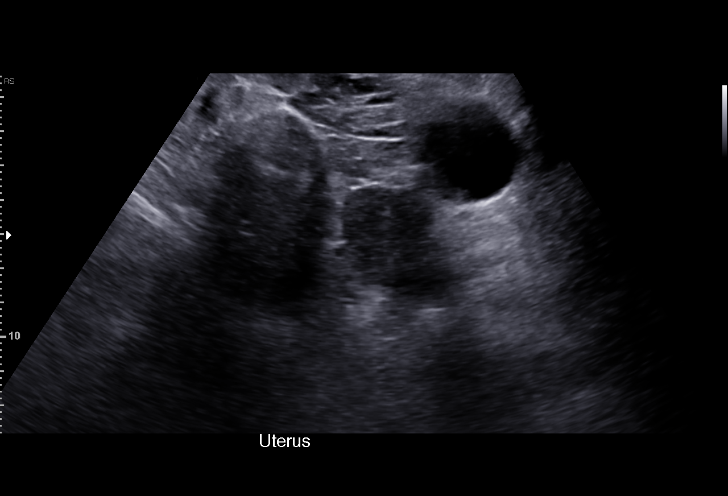
[im 13/44]
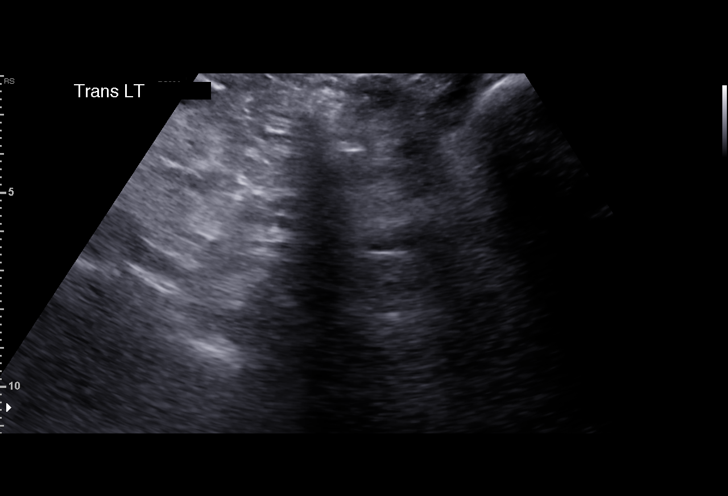
[im 17/44]
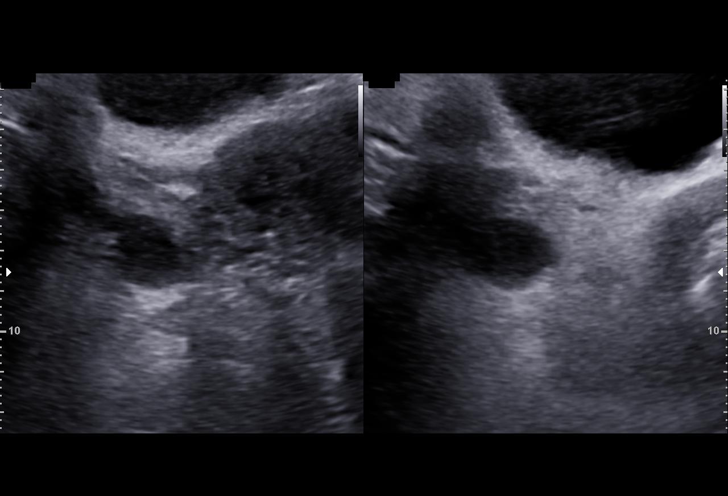
[im 18/44]
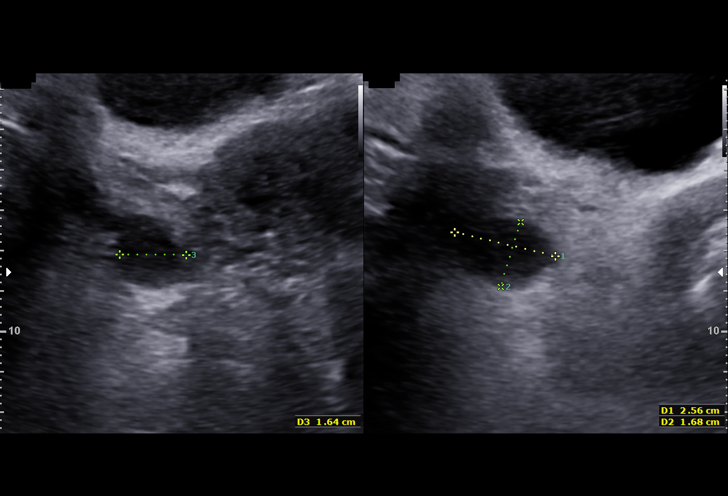
[im 22/44]
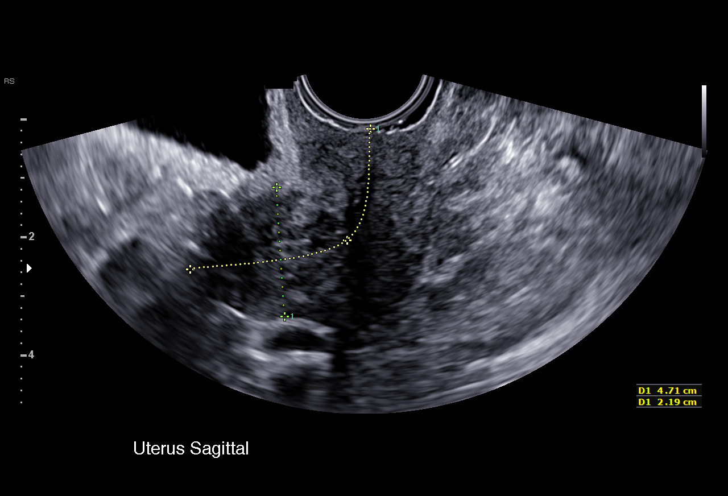
[im 26/44]
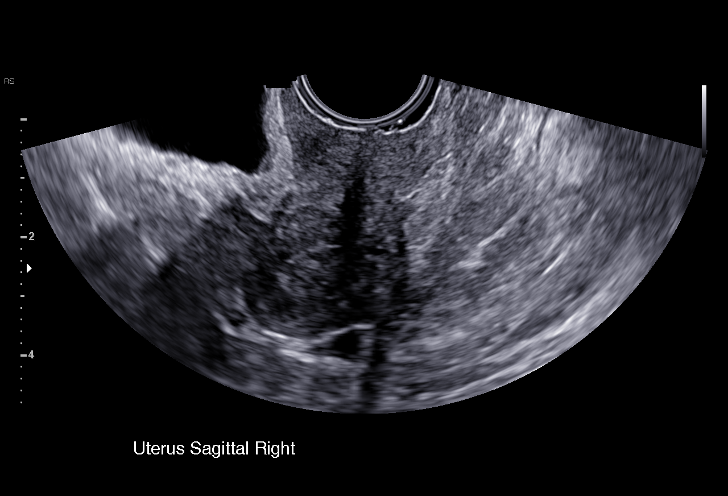
[im 27/44]
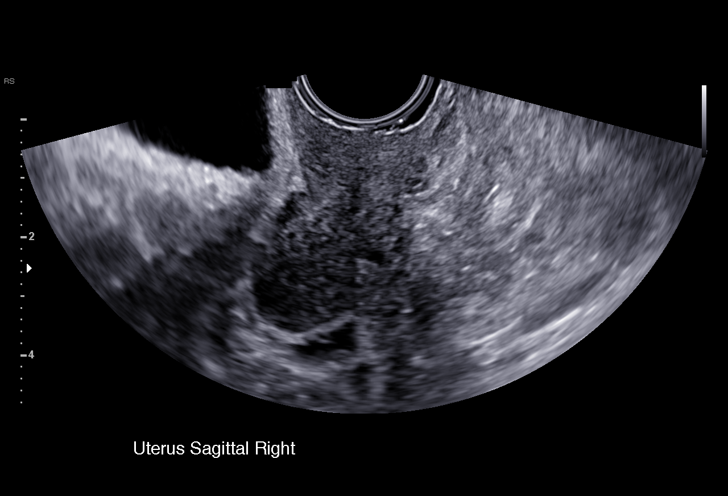
[im 31/44]
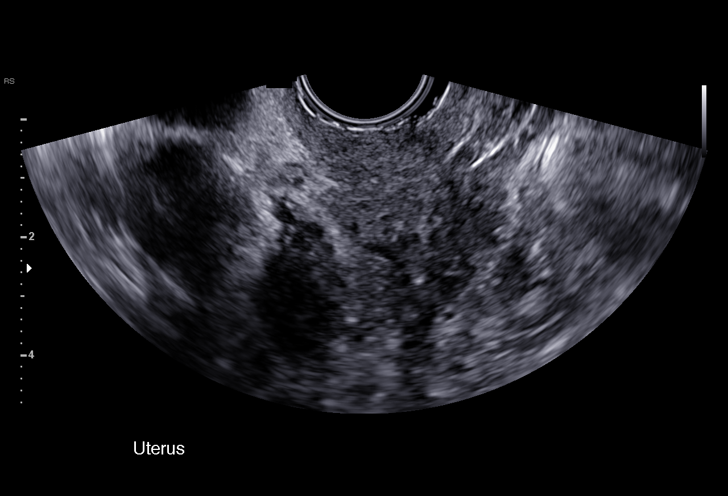
[im 35/44]
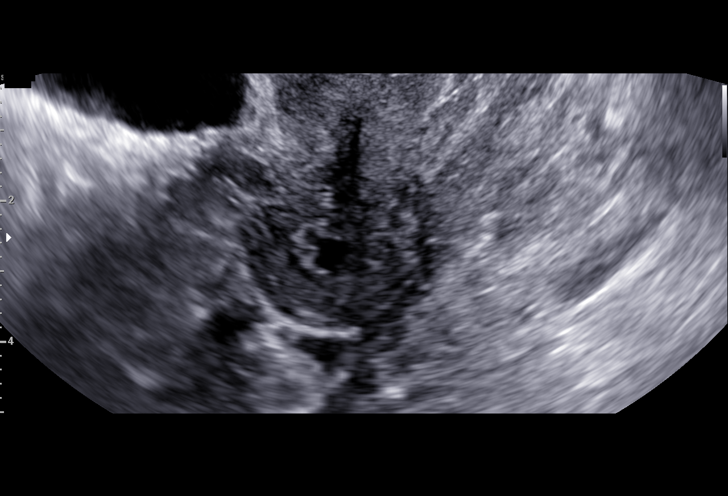
[im 36/44]
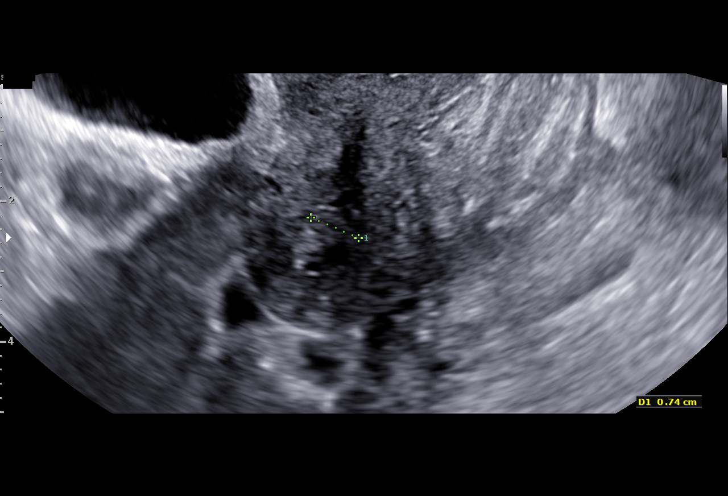
[im 40/44]
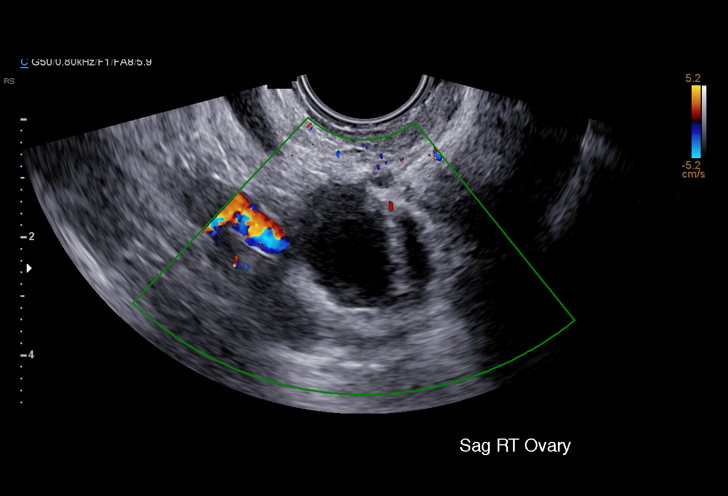
[im 44/44]
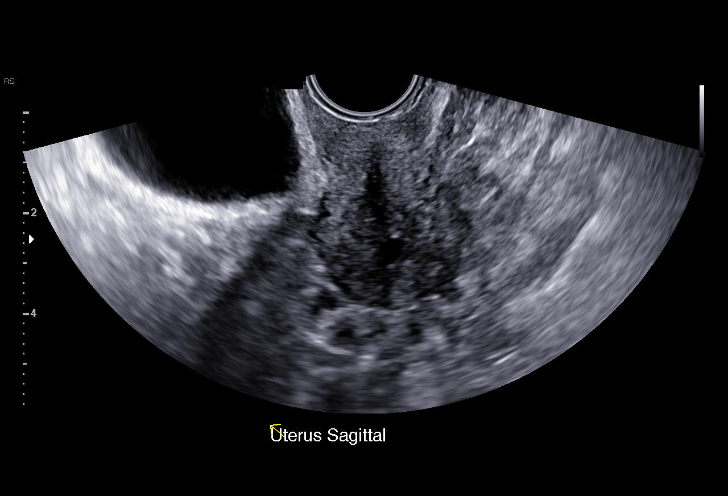

[15 of 25 positions shown; findings below may reference images not displayed]

FINDINGS: Uterus

Measurements: 4.7 x 2.2 x 3.5 cm. Atrophic appearance. No discrete
mass.

Endometrium

Thickness: 7 mm thick. Small amount of endometrial fluid,
nonspecific.

Right ovary

Measurements: 2.5 x 1.9 x 2.0 cm. Complicated cyst within RIGHT
ovary containing scattered internal echoes and a few echogenic foci,
1.4 x 1.3 x 1.5 cm. No additional RIGHT ovarian mass.

Left ovary

Inadequately visualized due to bowel gas

Other findings

No free pelvic fluid or adnexal masses otherwise seen.
IMPRESSION: Small mildly complicated cyst within RIGHT ovary 1.5 cm in greatest
size, decreased from 1.8 cm on the previous study.

Nonspecific endometrial fluid with prominent endometrial complex 7
mm thick; recommend correlation for postmenopausal bleeding and
consideration of endometrial sampling if clinically warranted.

## 2018-08-06 ENCOUNTER — Telehealth: Payer: Self-pay | Admitting: Family Medicine

## 2018-08-06 DIAGNOSIS — J683 Other acute and subacute respiratory conditions due to chemicals, gases, fumes and vapors: Secondary | ICD-10-CM

## 2018-08-07 ENCOUNTER — Other Ambulatory Visit: Payer: Self-pay

## 2018-08-07 DIAGNOSIS — J683 Other acute and subacute respiratory conditions due to chemicals, gases, fumes and vapors: Secondary | ICD-10-CM

## 2018-08-07 MED ORDER — ALBUTEROL SULFATE HFA 108 (90 BASE) MCG/ACT IN AERS: 2.0000 | INHALATION_SPRAY | Freq: Four times a day (QID) | RESPIRATORY_TRACT | 0 refills | Status: DC | PRN

## 2018-08-07 NOTE — Telephone Encounter (Signed)
Patient is requesting a refill of this medication until she can be set up for [facility] appointment. Unable to reach office to schedule.

## 2019-01-11 DIAGNOSIS — Z1159 Encounter for screening for other viral diseases: Secondary | ICD-10-CM | POA: Diagnosis not present

## 2019-01-16 DIAGNOSIS — D128 Benign neoplasm of rectum: Secondary | ICD-10-CM | POA: Diagnosis not present

## 2019-01-16 DIAGNOSIS — D125 Benign neoplasm of sigmoid colon: Secondary | ICD-10-CM | POA: Diagnosis not present

## 2019-01-16 DIAGNOSIS — Z1211 Encounter for screening for malignant neoplasm of colon: Secondary | ICD-10-CM | POA: Diagnosis not present

## 2019-01-16 DIAGNOSIS — K640 First degree hemorrhoids: Secondary | ICD-10-CM | POA: Diagnosis not present

## 2019-01-16 DIAGNOSIS — K635 Polyp of colon: Secondary | ICD-10-CM | POA: Diagnosis not present

## 2019-01-16 DIAGNOSIS — D123 Benign neoplasm of transverse colon: Secondary | ICD-10-CM | POA: Diagnosis not present

## 2019-01-18 ENCOUNTER — Telehealth: Payer: Self-pay | Admitting: Family Medicine

## 2019-01-18 ENCOUNTER — Encounter: Payer: Self-pay | Admitting: Family Medicine

## 2019-01-18 DIAGNOSIS — K635 Polyp of colon: Secondary | ICD-10-CM | POA: Diagnosis not present

## 2019-01-18 DIAGNOSIS — R29890 Loss of height: Secondary | ICD-10-CM | POA: Diagnosis not present

## 2019-01-18 DIAGNOSIS — D123 Benign neoplasm of transverse colon: Secondary | ICD-10-CM | POA: Diagnosis not present

## 2019-01-18 DIAGNOSIS — Z780 Asymptomatic menopausal state: Secondary | ICD-10-CM | POA: Diagnosis not present

## 2019-01-18 DIAGNOSIS — Z803 Family history of malignant neoplasm of breast: Secondary | ICD-10-CM | POA: Diagnosis not present

## 2019-01-18 DIAGNOSIS — Z1231 Encounter for screening mammogram for malignant neoplasm of breast: Secondary | ICD-10-CM | POA: Diagnosis not present

## 2019-01-18 DIAGNOSIS — D128 Benign neoplasm of rectum: Secondary | ICD-10-CM | POA: Diagnosis not present

## 2019-01-18 DIAGNOSIS — D125 Benign neoplasm of sigmoid colon: Secondary | ICD-10-CM | POA: Diagnosis not present

## 2019-01-18 DIAGNOSIS — M8589 Other specified disorders of bone density and structure, multiple sites: Secondary | ICD-10-CM | POA: Diagnosis not present

## 2019-01-18 LAB — HM DEXA SCAN

## 2019-01-18 NOTE — Telephone Encounter (Signed)
[facility] faxed form for physician signature. Form was signed and faxed. Confirmation received

## 2019-01-31 NOTE — Progress Notes (Signed)
FINAL MICROSCOPIC DX: LG Intestione- Transverse colon, Sigmoid Rectum, Polyp TUBULAR ADENOMAS(3). HYPERPLASTI POLYP WITH FEATURES OF MUCOSAL PROLAPSE.

## 2019-02-05 DIAGNOSIS — H10413 Chronic giant papillary conjunctivitis, bilateral: Secondary | ICD-10-CM | POA: Diagnosis not present

## 2019-02-05 DIAGNOSIS — H40013 Open angle with borderline findings, low risk, bilateral: Secondary | ICD-10-CM | POA: Diagnosis not present

## 2019-02-05 DIAGNOSIS — H2513 Age-related nuclear cataract, bilateral: Secondary | ICD-10-CM | POA: Diagnosis not present

## 2019-02-05 DIAGNOSIS — H43813 Vitreous degeneration, bilateral: Secondary | ICD-10-CM | POA: Diagnosis not present

## 2019-03-15 ENCOUNTER — Encounter: Payer: PPO | Admitting: Family Medicine

## 2019-03-17 NOTE — Progress Notes (Deleted)
QUICK REFERENCE INFORMATION: The ABCs of Providing the Annual Wellness Visit  CMS.gov Medicare Learning Network  [employer] Annual Wellness Visit  Subjective:   Rhonda Burns is a 73 y.o. Female who presents for an Annual Wellness Visit.  Patient Active Problem List   Diagnosis Date Noted  . Abdominal pain, chronic, right upper quadrant 09/29/2015  . Chest pain 08/06/2013  . DOE (dyspnea on exertion) 08/06/2013  . Reactive airways dysfunction syndrome ([place]) 09/27/2012  . Hypertension   . Hyperlipidemia   . Osteoporosis   . Allergic rhinitis, cause unspecified   . GERD (gastroesophageal reflux disease)     Past Medical History:  Diagnosis Date  . Abdominal pain, chronic, right upper quadrant   . Allergic rhinitis, cause unspecified   . Allergy   . Asthma   . Cataract    growing cataracts  . GERD (gastroesophageal reflux disease)   . Hiatal hernia   . Hyperlipidemia   . Hypertension   . Osteoporosis   . Osteoporosis   . Schatzki's ring      Past Surgical History:  Procedure Laterality Date  . CESAREAN SECTION    . COLONOSCOPY       Outpatient Medications Prior to Visit  Medication Sig Dispense Refill  . acetaminophen (TYLENOL) 500 MG tablet Take 500 mg by mouth every 8 (eight) hours as needed.    Marland Kitchen albuterol (VENTOLIN HFA) 108 (90 Base) MCG/ACT inhaler Inhale 2 puffs into the lungs every 6 (six) hours as needed for wheezing. 3 Inhaler 0  . aspirin 325 MG tablet Take 325 mg by mouth daily.    . calcium gluconate 500 MG tablet Take 500 mg by mouth daily.    . Cholecalciferol (D3 SUPER STRENGTH) 2000 UNITS CAPS Take by mouth once.    . fluticasone (FLONASE) 50 MCG/ACT nasal spray Place 2 sprays into both nostrils daily as needed for allergies. 16 g 5  . lisinopril (PRINIVIL,ZESTRIL) 5 MG tablet Take 1 tablet (5 mg total) by mouth daily. 90 tablet 3  . omeprazole (PRILOSEC) 40 MG capsule Take 1 capsule (40 mg total) by mouth daily. 30 minutes before a meal 30 capsule 1   . pravastatin (PRAVACHOL) 40 MG tablet TAKE 1 TABLET DAILY. 90 tablet 3  . triamcinolone cream (KENALOG) 0.1 % Apply 1 application topically 2 (two) times daily. 80 g 1   No facility-administered medications prior to visit.     Allergies  Allergen Reactions  . Other Swelling    Pecans and coconut     Family History  Problem Relation Age of Onset  . Alzheimer's disease Mother   . Heart disease Mother   . Stroke Mother   . Asthma Sister   . Heart disease Father   . Heart attack Father   . Diabetes Father   . Colon cancer Neg Hx   . Esophageal cancer Neg Hx      Social History   Socioeconomic History  . Marital status: Widowed    Spouse name: Not on file  . Number of children: 1  . Years of education: 52  . Highest education level: Some college, no degree  Occupational History  . Occupation: retired  Scientific laboratory technician  . Financial resource strain: Not hard at all  . Food insecurity    Worry: Never true    Inability: Never true  . Transportation needs    Medical: No    Non-medical: No  Tobacco Use  . Smoking status: Former Research scientist (life sciences)  . Smokeless tobacco:  Never Used  Substance and Sexual Activity  . Alcohol use: No    Alcohol/week: 0.0 standard drinks  . Drug use: No  . Sexual activity: Not on file  Lifestyle  . Physical activity    Days per week: 7 days    Minutes per session: 120 min  . Stress: Not at all  Relationships  . Social connections    Talks on phone: More than three times a week    Gets together: More than three times a week    Attends religious service: More than 4 times per year    Active member of club or organization: Yes    Attends meetings of clubs or organizations: More than 4 times per year    Relationship status: Widowed  Other Topics Concern  . Not on file  Social History Narrative   Exercise walking 7 days per week for 1 hour      Recent Hospitalizations? {Responses; yes/no (default no):140031::"No"}  Health Habits: Current  exercise activities include: {misc; exercise types:16438} Exercise: {NUMBERS; 0-10:5044} times/week. Diet: {Desc; diets:16563}  Alcohol intake: ***  Health Risk Assessment: The patient has completed a Health Risk Assessment. This has been reveiwed with them and has been scanned into the [facility] system as an attached document.  Current Medical Providers and Suppliers: [facility] Patient Care Team: Shawnee Knapp, MD as PCP - General (Family Medicine) Warden Fillers, MD as Consulting Physician (Ophthalmology) Gardiner Barefoot, DPM as Consulting Physician (Podiatry) Armbruster, Carlota Raspberry, MD as Consulting Physician (Gastroenterology) Future Appointments  Date Time Provider [place]  03/18/2019  9:20 AM Forrest Moron, MD PCP-PCP [facility]   ***  Age-appropriate Screening Schedule: The list below includes current immunization status and future screening recommendations based on patient's age. Orders for these recommended tests are listed in the plan section. The patient has been provided with a written plan. Immunization History  Administered Date(s) Administered  . PPD Test 02/22/2005  . Pneumococcal Conjugate-13 03/11/2015  . Pneumococcal Polysaccharide-23 03/10/2009, 03/11/2014  . Td 08/21/1996, 07/04/2006  . Tdap 01/02/2018  . Zoster 09/28/2012    Health Maintenance reviewed -  {Health maintenance:5237}   Depression Screen-PHQ2/9 completed today  Depression screen [facility] 2/9 03/15/2018 09/02/2017 09/02/2017 08/31/2017 08/26/2017  Decreased Interest 0 0 0 0 0  Down, Depressed, Hopeless 0 0 0 0 0  PHQ - 2 Score 0 0 0 0 0       Depression Severity and Treatment Recommendations:  0-4= None  5-9= Mild / Treatment: Support, educate to call if worse; return in one month  10-14= Moderate / Treatment: Support, watchful waiting; Antidepressant or Psycotherapy  15-19= Moderately severe / Treatment: Antidepressant OR Psychotherapy  >= 20 = Major depression, severe / Antidepressant AND  Psychotherapy  Functional Status Survey:      Hearing Evaluation: 1. Do you have trouble hearing the television when others do not?   {Responses; yes/no (default no):140031::"No"} 2. Do you have to strain to hear/understand conversations? {Responses; yes/no (default no):140031::"No"}   Advanced Care Planning: 1. Patient has executed an Advance Directive: {Responses; yes/no (default no):140031::"No"} 2. If no, patient was given the opportunity to execute an Advance Directive today? {Responses; yes/no (default no):140031::"No"} 3. Are the patient's advanced directives in [place]? {Responses; yes/no (default no):140031::"No"} 4. This patient has the ability to prepare an Advance Directive: {Responses; yes/no (default no):140031::"No"} 5. Provider is willing to follow the patient's wishes: {Responses; yes/no (default no):140031::"No"}  Cognitive Assessment: Does the patient have evidence of cognitive impairment? {Responses; yes/no (default  no):140031::"No"} The patient does not have any evidence of any cognitive problems and denies any  change in mood/affect, appearance, speech, memory or motor skills.  Identification of Risk Factors: Risk factors include: {Stroke risk factors:31118}  ROS  Objective:   There were no vitals filed for this visit.  There is no height or weight on file to calculate BMI.  {exam, Complete:18323}    Assessment/Plan:   Patient Self-Management and Personalized Health Advice The patient has been provided with information about:  {lifestyle advice:315251}  During the course of the visit the patient was educated and counseled about appropriate screening and preventive services including:   {Well female plan:5788::"return annually or prn"}     There is no height or weight on file to calculate BMI. Discussed the patient's BMI with her. The BMI {BMI plan (MU NQF measure 421):19504}  There are no diagnoses linked to this encounter.    No follow-ups on  file.  Future Appointments  Date Time Provider [place]  03/18/2019  9:20 AM Forrest Moron, MD PCP-PCP [facility]    There are no Patient Instructions on file for this visit.  An after visit summary with all of these plans was given to the patient.

## 2019-03-18 ENCOUNTER — Encounter: Payer: PPO | Admitting: Family Medicine

## 2019-05-27 ENCOUNTER — Other Ambulatory Visit: Payer: Self-pay

## 2019-05-27 ENCOUNTER — Encounter: Payer: Self-pay | Admitting: Family Medicine

## 2019-05-27 ENCOUNTER — Ambulatory Visit (INDEPENDENT_AMBULATORY_CARE_PROVIDER_SITE_OTHER): Payer: PPO | Admitting: Family Medicine

## 2019-05-27 VITALS — BP 122/82 | HR 74 | Temp 98.5°F | Ht 60.04 in | Wt 141.4 lb

## 2019-05-27 DIAGNOSIS — I10 Essential (primary) hypertension: Secondary | ICD-10-CM | POA: Diagnosis not present

## 2019-05-27 DIAGNOSIS — E785 Hyperlipidemia, unspecified: Secondary | ICD-10-CM

## 2019-05-27 DIAGNOSIS — J683 Other acute and subacute respiratory conditions due to chemicals, gases, fumes and vapors: Secondary | ICD-10-CM | POA: Diagnosis not present

## 2019-05-27 DIAGNOSIS — K219 Gastro-esophageal reflux disease without esophagitis: Secondary | ICD-10-CM

## 2019-05-27 MED ORDER — OMEPRAZOLE 40 MG PO CPDR: 40.0000 mg | DELAYED_RELEASE_CAPSULE | Freq: Every day | ORAL | 0 refills | Status: DC | PRN

## 2019-05-27 MED ORDER — ALBUTEROL SULFATE HFA 108 (90 BASE) MCG/ACT IN AERS: 2.0000 | INHALATION_SPRAY | Freq: Four times a day (QID) | RESPIRATORY_TRACT | 11 refills | Status: DC | PRN

## 2019-05-27 MED ORDER — PRAVASTATIN SODIUM 40 MG PO TABS: ORAL_TABLET | ORAL | 3 refills | Status: DC

## 2019-05-27 MED ORDER — LISINOPRIL 5 MG PO TABS: 5.0000 mg | ORAL_TABLET | Freq: Every day | ORAL | 3 refills | Status: DC

## 2019-05-27 NOTE — Patient Instructions (Signed)
° ° ° °  If you have lab work done today you will be contacted with your lab results within the next 2 weeks.  If you have not heard from us then please contact us. The fastest way to get your results is to register for My Chart. ° ° °IF you received an x-ray today, you will receive an invoice from [facility]. Please contact [facility] at [phone] with questions or concerns regarding your invoice.  ° °IF you received labwork today, you will receive an invoice from [facility]. Please contact [facility] at [phone] with questions or concerns regarding your invoice.  ° °Our billing staff will not be able to assist you with questions regarding bills from these companies. ° °You will be contacted with the lab results as soon as they are available. The fastest way to get your results is to activate your My Chart account. Instructions are located on the last page of this paperwork. If you have not heard from us regarding the results in 2 weeks, please contact this office. °  ° ° ° °

## 2019-05-27 NOTE — Progress Notes (Signed)
2/1/20218:37 AM  Rhonda Burns 1945-07-13, 74 y.o., female [id number]  Chief Complaint  Patient presents with  . Back Pain  . Medication Refill    HPI:   Patient is a 74 y.o. female with past medical history significant for HTN, HLP, GERD, hearing loss, chronic low back pain, B hip OA, RAD, colonic polyps osteopenia who presents today to establish care  Previous PCP Dr Brigitte Pulse Last OV Nov 2019  Patient overall doing well She is taking her meds as prescribed She does not check BP at home She uses albuterol prn, sporadically, mostly during the spring.  She takes APAP for her back pain She takes omperazole prn GERD She has no acute concerns today   Depression screen [facility] 2/9 05/27/2019 03/15/2018 09/02/2017  Decreased Interest 0 0 0  Down, Depressed, Hopeless 0 0 0  PHQ - 2 Score 0 0 0    Fall Risk  05/27/2019 03/15/2018 09/02/2017 09/02/2017 08/31/2017  Falls in the past year? 0 0 No No No  Number falls in past yr: 0 0 - - -  Injury with Fall? 0 0 - - -     Allergies  Allergen Reactions  . Other Swelling    Pecans and coconut    Prior to Admission medications   Medication Sig Start Date End Date Taking? Authorizing Provider  acetaminophen (TYLENOL) 500 MG tablet Take 500 mg by mouth every 8 (eight) hours as needed.   Yes [provider]  albuterol (VENTOLIN HFA) 108 (90 Base) MCG/ACT inhaler Inhale 2 puffs into the lungs every 6 (six) hours as needed for wheezing. 08/07/18 06/16/20 Yes Forrest Moron, MD  aspirin EC 81 MG tablet Take 81 mg by mouth daily.   Yes [provider]  calcium gluconate 500 MG tablet Take 500 mg by mouth daily.   Yes [provider]  Cholecalciferol (D3 SUPER STRENGTH) 2000 UNITS CAPS Take by mouth once.   Yes [provider]  fluticasone (FLONASE) 50 MCG/ACT nasal spray Place 2 sprays into both nostrils daily as needed for allergies. 08/24/17  Yes Shawnee Knapp, MD  lisinopril (PRINIVIL,ZESTRIL) 5 MG tablet Take 1  tablet (5 mg total) by mouth daily. 03/15/18  Yes Shawnee Knapp, MD  omeprazole (PRILOSEC) 40 MG capsule Take 1 capsule (40 mg total) by mouth daily. 30 minutes before a meal 03/15/18  Yes Shawnee Knapp, MD  pravastatin (PRAVACHOL) 40 MG tablet TAKE 1 TABLET DAILY. 03/15/18  Yes Shawnee Knapp, MD  triamcinolone cream (KENALOG) 0.1 % Apply 1 application topically 2 (two) times daily. 03/15/18  Yes Shawnee Knapp, MD    Past Medical History:  Diagnosis Date  . Abdominal pain, chronic, right upper quadrant   . Allergic rhinitis, cause unspecified   . Allergy   . Asthma   . Cataract    growing cataracts  . GERD (gastroesophageal reflux disease)   . Hiatal hernia   . Hyperlipidemia   . Hypertension   . Osteoporosis   . Osteoporosis   . Schatzki's ring     Past Surgical History:  Procedure Laterality Date  . CESAREAN SECTION    . COLONOSCOPY      Social History   Tobacco Use  . Smoking status: Former Research scientist (life sciences)  . Smokeless tobacco: Never Used  Substance Use Topics  . Alcohol use: No    Alcohol/week: 0.0 standard drinks    Family History  Problem Relation Age of Onset  . Alzheimer's disease Mother   .  Heart disease Mother   . Stroke Mother   . Asthma Sister   . Heart disease Father   . Heart attack Father   . Diabetes Father   . Colon cancer Neg Hx   . Esophageal cancer Neg Hx     Review of Systems  Constitutional: Negative for chills and fever.  Respiratory: Negative for cough, shortness of breath and wheezing.   Cardiovascular: Negative for chest pain, palpitations and leg swelling.  Gastrointestinal: Positive for abdominal pain (epigastric, intermittent) and heartburn. Negative for blood in stool, melena, nausea and vomiting.  Genitourinary: Negative for dysuria and hematuria.   Per hpi  OBJECTIVE:  Today's Vitals   05/27/19 0832  BP: 122/82  Pulse: 74  Temp: 98.5 F (36.9 C)  SpO2: 97%  Weight: 141 lb 6.4 oz (64.1 kg)  Height: 5' 0.04" (1.525 m)   Body mass  index is 27.58 kg/m.   Physical Exam Vitals and nursing note reviewed.  Constitutional:      Appearance: She is well-developed.  HENT:     Head: Normocephalic and atraumatic.     Mouth/Throat:     Pharynx: No oropharyngeal exudate.  Eyes:     General: No scleral icterus.    Conjunctiva/sclera: Conjunctivae normal.     Pupils: Pupils are equal, round, and reactive to light.  Cardiovascular:     Rate and Rhythm: Normal rate and regular rhythm.     Heart sounds: Normal heart sounds. No murmur. No friction rub. No gallop.   Pulmonary:     Effort: Pulmonary effort is normal.     Breath sounds: Normal breath sounds. No wheezing or rales.  Musculoskeletal:     Cervical back: Neck supple.  Skin:    General: Skin is warm and dry.  Neurological:     Mental Status: She is alert and oriented to person, place, and time.     No results found for this or any previous visit (from the past 24 hour(s)).  No results found.   ASSESSMENT and PLAN  1. Essential hypertension Controlled. Continue current regime.  - Lipid panel - TSH - CMP14+EGFR - lisinopril (ZESTRIL) 5 MG tablet; Take 1 tablet (5 mg total) by mouth daily.  2. Hyperlipidemia, unspecified hyperlipidemia type Checking labs today, medications will be adjusted as needed.  - Lipid panel - TSH - CMP14+EGFR - pravastatin (PRAVACHOL) 40 MG tablet; TAKE 1 TABLET DAILY.  3. Gastroesophageal reflux disease without esophagitis Stable. Cont omeprazole prn  4. Reactive airways dysfunction syndrome, unspecified asthma severity, uncomplicated (HCC) Stable. Cont albuterol prn - albuterol (VENTOLIN HFA) 108 (90 Base) MCG/ACT inhaler; Inhale 2 puffs into the lungs every 6 (six) hours as needed for wheezing.  Other orders - aspirin EC 81 MG tablet; Take 81 mg by mouth daily. - omeprazole (PRILOSEC) 40 MG capsule; Take 1 capsule (40 mg total) by mouth daily as needed (heartburn). 30 minutes before a meal  Return in about 6 months  (around 11/24/2019).    Rutherford Guys, MD [facility] at [facility] [place], [place] [address] Ph.  [phone] Fax [phone]

## 2019-05-28 ENCOUNTER — Telehealth: Payer: Self-pay | Admitting: Family Medicine

## 2019-05-28 LAB — CMP14+EGFR
ALT: 17 IU/L (ref 0–32)
AST: 21 IU/L (ref 0–40)
Albumin/Globulin Ratio: 1.6 (ref 1.2–2.2)
Albumin: 4.2 g/dL (ref 3.7–4.7)
Alkaline Phosphatase: 99 IU/L (ref 39–117)
BUN/Creatinine Ratio: 16 (ref 12–28)
BUN: 13 mg/dL (ref 8–27)
Bilirubin Total: 0.5 mg/dL (ref 0.0–1.2)
CO2: 24 mmol/L (ref 20–29)
Calcium: 9.5 mg/dL (ref 8.7–10.3)
Chloride: 106 mmol/L (ref 96–106)
Creatinine, Ser: 0.79 mg/dL (ref 0.57–1.00)
GFR calc Af Amer: 86 mL/min/{1.73_m2} (ref >59)
GFR calc non Af Amer: 74 mL/min/{1.73_m2} (ref >59)
Globulin, Total: 2.6 g/dL (ref 1.5–4.5)
Glucose: 102 mg/dL — ABNORMAL HIGH (ref 65–99)
Potassium: 3.8 mmol/L (ref 3.5–5.2)
Sodium: 143 mmol/L (ref 134–144)
Total Protein: 6.8 g/dL (ref 6.0–8.5)

## 2019-05-28 LAB — LIPID PANEL
Chol/HDL Ratio: 2.5 ratio (ref 0.0–4.4)
Cholesterol, Total: 118 mg/dL (ref 100–199)
HDL: 48 mg/dL (ref >39)
LDL Chol Calc (NIH): 51 mg/dL (ref 0–99)
Triglycerides: 103 mg/dL (ref 0–149)
VLDL Cholesterol Cal: 19 mg/dL (ref 5–40)

## 2019-05-28 LAB — TSH: TSH: 1.54 u[IU]/mL (ref 0.450–4.500)

## 2019-05-28 NOTE — Telephone Encounter (Signed)
[facility]ist calling and needs to verify pt's allergy information in order to fill her scripts hat were just sent  Direct line [phone]  Ref no: [id number]

## 2019-05-28 NOTE — Telephone Encounter (Signed)
Spoke with Shanon Brow at [facility] and informed him that pt is allergic to pecans and coconut. No Drug allergies noted. Shanon Brow verbalized understanding.

## 2019-05-30 ENCOUNTER — Telehealth: Payer: Self-pay | Admitting: Family Medicine

## 2019-05-30 NOTE — Telephone Encounter (Signed)
Pt. Wants to talk to clinical about the meds. provider proscribed on 05/27/19 also wants change her pharmacy to [facility] on [address]. Number [phone] please advice

## 2019-05-31 NOTE — Telephone Encounter (Signed)
Pt said that she needs the medication to go to [facility] on [address]

## 2019-05-31 NOTE — Telephone Encounter (Signed)
Attempted to call pt. No answer so I left a message to call back.   Plan: Clarify the request since all the [facility] is [facility]. [facility] or is pt just needing a buffer due to being out of medication.

## 2019-06-03 ENCOUNTER — Other Ambulatory Visit: Payer: Self-pay | Admitting: Emergency Medicine

## 2019-06-03 DIAGNOSIS — J683 Other acute and subacute respiratory conditions due to chemicals, gases, fumes and vapors: Secondary | ICD-10-CM

## 2019-06-03 DIAGNOSIS — I10 Essential (primary) hypertension: Secondary | ICD-10-CM

## 2019-06-03 DIAGNOSIS — K219 Gastro-esophageal reflux disease without esophagitis: Secondary | ICD-10-CM

## 2019-06-03 DIAGNOSIS — E785 Hyperlipidemia, unspecified: Secondary | ICD-10-CM

## 2019-06-03 MED ORDER — ALBUTEROL SULFATE HFA 108 (90 BASE) MCG/ACT IN AERS: 2.0000 | INHALATION_SPRAY | Freq: Four times a day (QID) | RESPIRATORY_TRACT | 11 refills | Status: AC | PRN

## 2019-06-03 MED ORDER — PRAVASTATIN SODIUM 40 MG PO TABS: ORAL_TABLET | ORAL | 3 refills | Status: AC

## 2019-06-03 MED ORDER — LISINOPRIL 5 MG PO TABS: 5.0000 mg | ORAL_TABLET | Freq: Every day | ORAL | 3 refills | Status: DC

## 2019-06-03 MED ORDER — OMEPRAZOLE 40 MG PO CPDR: 40.0000 mg | DELAYED_RELEASE_CAPSULE | Freq: Every day | ORAL | 0 refills | Status: DC | PRN

## 2019-06-03 NOTE — Telephone Encounter (Signed)
Pt Called again in regards to her medications being sent to [facility] on [address]   Please advise

## 2019-06-03 NOTE — Telephone Encounter (Signed)
All rx has been sent to [facility] on [address] due to the mail order was not approved due to ins. Send all meds to [facility] until this all get straighten out

## 2019-07-09 ENCOUNTER — Telehealth: Payer: Self-pay | Admitting: *Deleted

## 2019-07-09 ENCOUNTER — Telehealth: Payer: Self-pay | Admitting: Family Medicine

## 2019-07-09 NOTE — Telephone Encounter (Signed)
Patient missed your call and she can be reached before noon most days to schedule awv

## 2019-07-09 NOTE — Telephone Encounter (Signed)
Schedule AWV.  

## 2019-07-11 NOTE — Telephone Encounter (Signed)
Expired error

## 2019-08-01 DIAGNOSIS — G3184 Mild cognitive impairment, so stated: Secondary | ICD-10-CM | POA: Diagnosis not present

## 2019-08-01 DIAGNOSIS — M160 Bilateral primary osteoarthritis of hip: Secondary | ICD-10-CM | POA: Diagnosis not present

## 2019-08-01 DIAGNOSIS — J668 Airway disease due to other specific organic dusts: Secondary | ICD-10-CM | POA: Diagnosis not present

## 2019-08-01 DIAGNOSIS — M545 Low back pain: Secondary | ICD-10-CM | POA: Diagnosis not present

## 2019-08-01 DIAGNOSIS — Z8489 Family history of other specified conditions: Secondary | ICD-10-CM | POA: Diagnosis not present

## 2019-08-01 DIAGNOSIS — I10 Essential (primary) hypertension: Secondary | ICD-10-CM | POA: Diagnosis not present

## 2019-08-01 DIAGNOSIS — K635 Polyp of colon: Secondary | ICD-10-CM | POA: Diagnosis not present

## 2019-08-01 DIAGNOSIS — R1909 Other intra-abdominal and pelvic swelling, mass and lump: Secondary | ICD-10-CM | POA: Diagnosis not present

## 2019-08-01 DIAGNOSIS — H9193 Unspecified hearing loss, bilateral: Secondary | ICD-10-CM | POA: Diagnosis not present

## 2019-08-01 DIAGNOSIS — R413 Other amnesia: Secondary | ICD-10-CM | POA: Diagnosis not present

## 2019-08-01 DIAGNOSIS — N734 Female chronic pelvic peritonitis: Secondary | ICD-10-CM | POA: Diagnosis not present

## 2019-08-01 DIAGNOSIS — R739 Hyperglycemia, unspecified: Secondary | ICD-10-CM | POA: Diagnosis not present

## 2019-08-01 DIAGNOSIS — M810 Age-related osteoporosis without current pathological fracture: Secondary | ICD-10-CM | POA: Diagnosis not present

## 2019-08-01 DIAGNOSIS — Z86010 Personal history of colonic polyps: Secondary | ICD-10-CM | POA: Diagnosis not present

## 2019-08-01 DIAGNOSIS — K219 Gastro-esophageal reflux disease without esophagitis: Secondary | ICD-10-CM | POA: Diagnosis not present

## 2019-08-02 ENCOUNTER — Other Ambulatory Visit: Payer: Self-pay | Admitting: Family Medicine

## 2019-08-02 ENCOUNTER — Ambulatory Visit
Admission: RE | Admit: 2019-08-02 | Discharge: 2019-08-02 | Disposition: A | Discharge status: Home / Self Care | Payer: PPO | Source: Ambulatory Visit | Attending: Family Medicine | Admitting: Family Medicine

## 2019-08-02 DIAGNOSIS — R52 Pain, unspecified: Secondary | ICD-10-CM

## 2019-08-02 DIAGNOSIS — M1611 Unilateral primary osteoarthritis, right hip: Secondary | ICD-10-CM

## 2019-08-29 DIAGNOSIS — H9193 Unspecified hearing loss, bilateral: Secondary | ICD-10-CM | POA: Diagnosis not present

## 2019-08-29 DIAGNOSIS — M19011 Primary osteoarthritis, right shoulder: Secondary | ICD-10-CM | POA: Diagnosis not present

## 2019-08-29 DIAGNOSIS — J668 Airway disease due to other specific organic dusts: Secondary | ICD-10-CM | POA: Diagnosis not present

## 2019-08-29 DIAGNOSIS — R072 Precordial pain: Secondary | ICD-10-CM | POA: Diagnosis not present

## 2019-08-29 DIAGNOSIS — N734 Female chronic pelvic peritonitis: Secondary | ICD-10-CM | POA: Diagnosis not present

## 2019-08-29 DIAGNOSIS — M79601 Pain in right arm: Secondary | ICD-10-CM | POA: Diagnosis not present

## 2019-08-29 DIAGNOSIS — K635 Polyp of colon: Secondary | ICD-10-CM | POA: Diagnosis not present

## 2019-08-29 DIAGNOSIS — K219 Gastro-esophageal reflux disease without esophagitis: Secondary | ICD-10-CM | POA: Diagnosis not present

## 2019-08-29 DIAGNOSIS — I10 Essential (primary) hypertension: Secondary | ICD-10-CM | POA: Diagnosis not present

## 2019-08-29 DIAGNOSIS — M160 Bilateral primary osteoarthritis of hip: Secondary | ICD-10-CM | POA: Diagnosis not present

## 2019-09-03 ENCOUNTER — Telehealth: Payer: Self-pay

## 2019-09-03 NOTE — Telephone Encounter (Signed)
NOTES ON FILE FROM [facility] [phone], SENT REFERRAL TO SCHEDULING

## 2019-09-06 ENCOUNTER — Ambulatory Visit: Payer: PPO | Attending: Family Medicine | Admitting: Rehabilitative and Restorative Service Providers"

## 2019-09-06 ENCOUNTER — Encounter: Payer: Self-pay | Admitting: Rehabilitative and Restorative Service Providers"

## 2019-09-06 ENCOUNTER — Other Ambulatory Visit: Payer: Self-pay

## 2019-09-06 DIAGNOSIS — M7541 Impingement syndrome of right shoulder: Secondary | ICD-10-CM

## 2019-09-06 DIAGNOSIS — M25511 Pain in right shoulder: Secondary | ICD-10-CM | POA: Diagnosis not present

## 2019-09-06 DIAGNOSIS — M778 Other enthesopathies, not elsewhere classified: Secondary | ICD-10-CM | POA: Insufficient documentation

## 2019-09-06 DIAGNOSIS — G8929 Other chronic pain: Secondary | ICD-10-CM | POA: Diagnosis not present

## 2019-09-06 NOTE — Patient Instructions (Addendum)
Access Code: [email or web address] URL: [email or web address] Date: 09/06/2019 Prepared by: Vista Mink  Exercises Supine Scapular Protraction in Flexion with Dumbbells - 2-3 x daily - 7 x weekly - 1 sets - 20 reps - 3 seconds hold Supine Shoulder Internal Rotation Stretch - 2-3 x daily - 7 x weekly - 1 sets - 10-50 reps - 10 secondsinutes hold Shoulder External Rotation with Anchored Resistance with Towel Under Elbow - 1 x daily - 7 x weekly - 2 sets - 10 reps - 3 hold Standing Scapular Retraction - 5 x daily - 7 x weekly - 1 sets - 5 reps - 5 hold

## 2019-09-06 NOTE — Therapy (Signed)
[place], [place], [address] Phone: [phone]   Fax:  [phone]  Physical Therapy Evaluation  Patient Details  Name: Rhonda Burns MRN: [id number] Date of Birth: 02/01/1946 Referring Provider (PT): Delman Cheadle   Encounter Date: 09/06/2019  PT End of Session - 09/06/19 1052    Visit Number  1    Number of Visits  12    Date for PT Re-Evaluation  10/18/19    PT Start Time  0930    PT Stop Time  [id number]    PT Time Calculation (min)  65 min    Activity Tolerance  Patient tolerated treatment well;No increased pain    Behavior During Therapy  WFL for tasks assessed/performed       Past Medical History:  Diagnosis Date  . Abdominal pain, chronic, right upper quadrant   . Allergic rhinitis, cause unspecified   . Allergy   . Asthma   . Cataract    growing cataracts  . GERD (gastroesophageal reflux disease)   . Hiatal hernia   . Hyperlipidemia   . Hypertension   . Osteoporosis   . Osteoporosis   . Schatzki's ring     Past Surgical History:  Procedure Laterality Date  . CESAREAN SECTION    . COLONOSCOPY      There were no vitals filed for this visit.   Subjective Assessment - 09/06/19 0938    Subjective  R shoulder has gone from aching to excrutiating, particularly at night.    Pertinent History  HTN.    Limitations  Other (comment)    How long can you sit comfortably?  As long as needed (ALAN)    How long can you stand comfortably?  ALAN    How long can you walk comfortably?  ALAN    Patient Stated Goals  Sleep better, throw ball to dog, return to using R shoulder as normal    Currently in Pain?  Yes    Pain Score  3     Pain Location  Shoulder    Pain Orientation  Right    Pain Descriptors / Indicators  Aching;Throbbing    Pain Type  Chronic pain    Pain Radiating Towards  Not quite to the elbow    Pain Onset  More than a month ago    Pain Frequency  Constant    Aggravating Factors    Sleeping    Pain Relieving Factors  Tylenol    Effect of Pain on Daily Activities  Limits R shoulder function         [facility] PT Assessment - 09/06/19 0001      Assessment   Medical Diagnosis  R shoulder impingement    Referring Provider (PT)  Delman Cheadle    Onset Date/Surgical Date  06/24/19    Hand Dominance  Right      Balance Screen   Has the patient fallen in the past 6 months  No    Has the patient had a decrease in activity level because of a fear of falling?   No    Is the patient reluctant to leave their home because of a fear of falling?   No      Prior Function   Level of Independence  Independent      Cognition   Overall Cognitive Status  Within Functional Limits for tasks assessed      ROM / Strength   AROM / PROM / Strength  AROM;Strength      AROM   Overall AROM   Deficits    AROM Assessment Site  Shoulder    Right/Left Shoulder  Left;Right    Right Shoulder Flexion  150 Degrees    Right Shoulder Internal Rotation  25 Degrees    Right Shoulder External Rotation  85 Degrees    Right Shoulder Horizontal  ADduction  20 Degrees    Left Shoulder Flexion  145 Degrees    Left Shoulder Internal Rotation  30 Degrees    Left Shoulder External Rotation  70 Degrees    Left Shoulder Horizontal ADduction  30 Degrees      Strength   Overall Strength  Deficits    Strength Assessment Site  Shoulder    Right/Left Shoulder  Right;Left    Right Shoulder Internal Rotation  4-/5    Right Shoulder External Rotation  3-/5    Left Shoulder Internal Rotation  4+/5    Left Shoulder External Rotation  4/5                  Objective measurements completed on examination: See above findings.      [facility] Adult PT Treatment/Exercise - 09/06/19 0001      Therapeutic Activites    Therapeutic Activities  --      Exercises   Exercises  Shoulder      Shoulder Exercises: Supine   Protraction  Strengthening;20 reps   Supine arm raise 3 second hold   Internal Rotation   AROM;20 reps   10 seconds, 80 degrees to side on 2 pillows   Other Supine Exercises  AAROM supine shoulder IR with PT 10 minutes      Shoulder Exercises: Standing   External Rotation  Strengthening    Theraband Level (Shoulder External Rotation)  Level 2 (Red)    External Rotation Weight (lbs)  2 sets of 10 with slow eccentrics and towel under elbow    Other Standing Exercises  Shoulder blade pinches 20X 5 seconds             PT Education - 09/06/19 1051    Education Details  HEP explanation, practice and review    Person(s) Educated  Patient    Methods  Explanation;Demonstration;Tactile cues;Verbal cues;Handout    Comprehension  Verbalized understanding;Tactile cues required;Need further instruction;Returned demonstration;Verbal cues required       PT Short Term Goals - 09/06/19 1102      PT SHORT TERM GOAL #1   Title  Kaylanie will be independent with her starter HEP in 2 weeks.    Time  2    Period  Weeks    Status  New    Target Date  09/20/19        PT Long Term Goals - 09/06/19 1103      PT LONG TERM GOAL #1   Title  Sherle will report R shoulder pain consistently 0-2/10 on the Numeric Pain Rating Scale and will be able to get a full night's sleep.    Baseline  Very interupted sleep with 3-6/10 pain.    Time  6    Period  Weeks    Status  New    Target Date  10/18/19      PT LONG TERM GOAL #2   Title  Odalis will improve flexion AROM to 170; IR to 60; ER to 90 and horizontal adduction to 40 degrees.    Baseline  See objective.    Time  6  Period  Weeks    Status  New    Target Date  10/18/19      PT LONG TERM GOAL #3   Title  Elaura will improve her R shoulder strength to at least 4/5 for ER and IR.    Baseline  See Objective.    Time  6    Period  Weeks    Status  New    Target Date  10/18/19      PT LONG TERM GOAL #4   Title  Drianna will be independent with her long-term HEP at DC.    Time  6    Period  Weeks    Status  New    Target Date   10/18/19             Plan - 09/06/19 1053    Clinical Impression Statement  Yorley has a chronic R shoulder RTC tendonitis/impingement.  She may have a partial RTC tear.  Our plan is to give physical therapy a 4 week trial to address posterior capsule tightness, scapular and rotator cuff weakness to see if sleep is able to improve.  Her prognosis is good, although a tear may already be present.    Personal Factors and Comorbidities  Comorbidity 1    Comorbidities  HTN    Examination-Activity Limitations  Sleep;Bed Mobility    Examination-Participation Restrictions  Church;Yard Work;Laundry;Cleaning;Meal Prep;Volunteer    Stability/Clinical Decision Making  Stable/Uncomplicated    Clinical Decision Making  Low    Rehab Potential  Good    PT Frequency  2x / week    PT Duration  6 weeks    PT Treatment/Interventions  ADLs/Self Care Home Management;Cryotherapy;Therapeutic activities;Therapeutic exercise;Neuromuscular re-education;Patient/family education;Manual techniques;Passive range of motion    PT Next Visit Plan  Continue posterior capsule stretching, scapular and rotator cuff strengthening.    PT Home Exercise Plan  [phone]    Consulted and Agree with Plan of Care  Patient       Patient will benefit from skilled therapeutic intervention in order to improve the following deficits and impairments:  Decreased range of motion, Decreased mobility, Decreased strength, Pain  Visit Diagnosis: Impingement syndrome of right shoulder  Chronic right shoulder pain  Shoulder tendonitis, right     Problem List Patient Active Problem List   Diagnosis Date Noted  . Abdominal pain, chronic, right upper quadrant 09/29/2015  . Chest pain 08/06/2013  . DOE (dyspnea on exertion) 08/06/2013  . Reactive airways dysfunction syndrome ([place]) 09/27/2012  . Hypertension   . Hyperlipidemia   . Osteoporosis   . Allergic rhinitis, cause unspecified   . GERD (gastroesophageal reflux disease)      Farley Ly PT, MPT 09/06/2019, 11:10 AM  [facility] [address] [place], [place], [address] Phone: [phone]   Fax:  [phone]  Name: Gathel Guinan MRN: [id number] Date of Birth: 10-24-1945

## 2019-09-12 ENCOUNTER — Other Ambulatory Visit: Payer: Self-pay

## 2019-09-12 ENCOUNTER — Ambulatory Visit: Payer: PPO

## 2019-09-12 DIAGNOSIS — G8929 Other chronic pain: Secondary | ICD-10-CM

## 2019-09-12 DIAGNOSIS — M778 Other enthesopathies, not elsewhere classified: Secondary | ICD-10-CM

## 2019-09-12 DIAGNOSIS — M7541 Impingement syndrome of right shoulder: Secondary | ICD-10-CM | POA: Diagnosis not present

## 2019-09-12 DIAGNOSIS — M25511 Pain in right shoulder: Secondary | ICD-10-CM

## 2019-09-12 NOTE — Therapy (Signed)
Rhonda Burns, [place], [address] Phone: [phone]   Fax:  [phone]  Physical Therapy Treatment  Patient Details  Name: Rhonda Burns MRN: [id number] Date of Birth: [birth year] Referring Provider (PT): Delman Cheadle   Encounter Date: 09/12/2019  PT End of Session - 09/12/19 1048    Visit Number  2    Number of Visits  12    Date for PT Re-Evaluation  10/18/19    PT Start Time  1025    PT Stop Time  1115    PT Time Calculation (min)  50 min    Activity Tolerance  Patient tolerated treatment well;No increased pain    Behavior During Therapy  WFL for tasks assessed/performed       Past Medical History:  Diagnosis Date  . Abdominal pain, chronic, right upper quadrant   . Allergic rhinitis, cause unspecified   . Allergy   . Asthma   . Cataract    growing cataracts  . GERD (gastroesophageal reflux disease)   . Hiatal hernia   . Hyperlipidemia   . Hypertension   . Osteoporosis   . Osteoporosis   . Schatzki's ring     Past Surgical History:  Procedure Laterality Date  . CESAREAN SECTION    . COLONOSCOPY      There were no vitals filed for this visit.  Subjective Assessment - 09/12/19 1028    Subjective  Pt reports her R shoulder did feel a little better after her visit last week with less stress, icing it, and doing "a few of the exercises".    Pertinent History  HTN.    Patient Stated Goals  Sleep better, throw ball to dog, return to using R shoulder as normal    Currently in Pain?  No/denies         [facility] PT Assessment - 09/12/19 0001      ROM / Strength   AROM / PROM / Strength  AROM;PROM      AROM   AROM Assessment Site  Shoulder    Right/Left Shoulder  Right    Right Shoulder Flexion  150 Degrees    Right Shoulder ABduction  145 Degrees      PROM   PROM Assessment Site  Shoulder    Right/Left Shoulder  Right    Right Shoulder External Rotation  45 Degrees                     [facility] Adult PT Treatment/Exercise - 09/12/19 0001      Exercises   Exercises  Shoulder      Shoulder Exercises: Supine   Protraction  Strengthening;Both;20 reps;Weights    Theraband Level (Shoulder Protraction)  Other (comment)    Protraction Weight (lbs)  3# DBs      Shoulder Exercises: Sidelying   External Rotation  Strengthening;Right    External Rotation Weight (lbs)  3# DB 3 x 30" isometric      Shoulder Exercises: Standing   External Rotation  Strengthening;Both    Theraband Level (Shoulder External Rotation)  Level 2 (Red)    External Rotation Weight (lbs)  2 sets of 10 with slow eccentrics and towel under elbow    Retraction Limitations  paired with ER at corner of wall with RTB      Modalities   Modalities  Vasopneumatic      Vasopneumatic   Number Minutes Vasopneumatic   15 minutes    Vasopnuematic Location  Shoulder    Vasopneumatic Pressure  Low    Vasopneumatic Temperature   34      Manual Therapy   Manual Therapy  Joint mobilization;Soft tissue mobilization;Passive ROM    Manual therapy comments  decreased inferior GHJ mobility    Joint Mobilization  inferior/post GHJ glides grade 3    Soft tissue mobilization  pec attachment to shoulder, posterior cuff    Passive ROM  IR stretching with humeral distraction and sustained posterior GHJ mob, PROM all planes               PT Short Term Goals - 09/06/19 1102      PT SHORT TERM GOAL #1   Title  Rhonda Burns will be independent with her starter HEP in 2 weeks.    Time  2    Period  Weeks    Status  New    Target Date  09/20/19        PT Long Term Goals - 09/06/19 1103      PT LONG TERM GOAL #1   Title  Rhonda Burns will report R shoulder pain consistently 0-2/10 on the Numeric Pain Rating Scale and will be able to get a full night's sleep.    Baseline  Very interupted sleep with 3-6/10 pain.    Time  6    Period  Weeks    Status  New    Target Date  10/18/19      PT LONG  TERM GOAL #2   Title  Rhonda Burns will improve flexion AROM to 170; IR to 60; ER to 90 and horizontal adduction to 40 degrees.    Baseline  See objective.    Time  6    Period  Weeks    Status  New    Target Date  10/18/19      PT LONG TERM GOAL #3   Title  Rhonda Burns will improve her R shoulder strength to at least 4/5 for ER and IR.    Baseline  See Objective.    Time  6    Period  Weeks    Status  New    Target Date  10/18/19      PT LONG TERM GOAL #4   Title  Rhonda Burns will be independent with her long-term HEP at DC.    Time  6    Period  Weeks    Status  New    Target Date  10/18/19            Plan - 09/12/19 1058    Clinical Impression Statement  Pt presents with improvement in R shoulder pain since initial evaluation. She continues to have stiffness greatest with inferior glides today in GHJ. Pt tolerated progression of ther ex well with 3# DB addition to supine protraction and S/L iso ER with 3# DB. She demonstrates motor control and coordination impairment in R shoulder with scap retract + depression and will continue to benefit from skilled PT to improve upon this.    Personal Factors and Comorbidities  Comorbidity 1    Comorbidities  HTN    Examination-Activity Limitations  Sleep;Bed Mobility    Examination-Participation Restrictions  Church;Yard Work;Laundry;Cleaning;Meal Prep;Volunteer    Stability/Clinical Decision Making  Stable/Uncomplicated    Rehab Potential  Good    PT Frequency  2x / week    PT Duration  6 weeks    PT Treatment/Interventions  ADLs/Self Care Home Management;Cryotherapy;Therapeutic activities;Therapeutic exercise;Neuromuscular re-education;Patient/family education;Manual techniques;Passive range of motion  PT Next Visit Plan  Continue posterior capsule stretching, scapular and rotator cuff strengthening. Motor control and coordination of R shoulder.    PT Home Exercise Plan  [phone]    Consulted and Agree with Plan of Care  Patient       Patient  will benefit from skilled therapeutic intervention in order to improve the following deficits and impairments:  Decreased range of motion, Decreased mobility, Decreased strength, Pain  Visit Diagnosis: Impingement syndrome of right shoulder  Chronic right shoulder pain  Shoulder tendonitis, right     Problem List Patient Active Problem List   Diagnosis Date Noted  . Abdominal pain, chronic, right upper quadrant 09/29/2015  . Chest pain 08/06/2013  . DOE (dyspnea on exertion) 08/06/2013  . Reactive airways dysfunction syndrome ([place] [address]) 09/27/2012  . Hypertension   . Hyperlipidemia   . Osteoporosis   . Allergic rhinitis, cause unspecified   . GERD (gastroesophageal reflux disease)     Rhonda Burns 09/12/2019, 11:04 AM  [facility] [address] [place], [place], [address] Phone: [phone]   Fax:  [phone]  Name: Rhonda Burns MRN: [id number] Date of Birth: 30-Apr-1945

## 2019-09-20 ENCOUNTER — Encounter: Payer: Self-pay | Admitting: Rehabilitative and Restorative Service Providers"

## 2019-09-20 ENCOUNTER — Other Ambulatory Visit: Payer: Self-pay

## 2019-09-20 ENCOUNTER — Ambulatory Visit: Payer: PPO | Admitting: Rehabilitative and Restorative Service Providers"

## 2019-09-20 DIAGNOSIS — M778 Other enthesopathies, not elsewhere classified: Secondary | ICD-10-CM

## 2019-09-20 DIAGNOSIS — M7541 Impingement syndrome of right shoulder: Secondary | ICD-10-CM

## 2019-09-20 DIAGNOSIS — G8929 Other chronic pain: Secondary | ICD-10-CM

## 2019-09-20 NOTE — Therapy (Signed)
[place], [place], [address] Phone: [phone]   Fax:  [phone]  Physical Therapy Treatment  Patient Details  Name: Rhonda Burns MRN: [id number] Date of Birth: 08-Oct-1945 Referring Provider (PT): Delman Cheadle   Encounter Date: 09/20/2019  PT End of Session - 09/20/19 1045    Visit Number  3    Number of Visits  12    Date for PT Re-Evaluation  10/18/19    PT Start Time  1000    PT Stop Time  1045    PT Time Calculation (min)  45 min    Activity Tolerance  Patient tolerated treatment well;No increased pain    Behavior During Therapy  WFL for tasks assessed/performed       Past Medical History:  Diagnosis Date  . Abdominal pain, chronic, right upper quadrant   . Allergic rhinitis, cause unspecified   . Allergy   . Asthma   . Cataract    growing cataracts  . GERD (gastroesophageal reflux disease)   . Hiatal hernia   . Hyperlipidemia   . Hypertension   . Osteoporosis   . Osteoporosis   . Schatzki's ring     Past Surgical History:  Procedure Laterality Date  . CESAREAN SECTION    . COLONOSCOPY      There were no vitals filed for this visit.  Subjective Assessment - 09/20/19 1000    Subjective  Rhonda Burns notes continued gains in her R shoulder pain-free range.    Pertinent History  HTN.    Patient Stated Goals  Sleep better, throw ball to dog, return to using R shoulder as normal    Currently in Pain?  No/denies    Effect of Pain on Daily Activities  Still careful with heavier activities and avoids walking dog or throwing ball to dog with R side.                        [facility] Adult PT Treatment/Exercise - 09/20/19 0001      Exercises   Exercises  Shoulder      Shoulder Exercises: Supine   Protraction  Strengthening;Both;20 reps;Weights    Theraband Level (Shoulder Protraction)  Other (comment)    Protraction Weight (lbs)  3# DBs    Internal Rotation  AROM;20 reps   10  seconds 2 sets (with & without PT overpressure)     Shoulder Exercises: Sidelying   External Rotation  Strengthening;Right    External Rotation Weight (lbs)  2 sets of 10 with 2# and slow eccentrics      Shoulder Exercises: Standing   External Rotation  Strengthening;Both    Theraband Level (Shoulder External Rotation)  Level 2 (Red)    External Rotation Weight (lbs)  2 sets of 10 with slow eccentrics and towel under elbow    Internal Rotation  Strengthening;10 reps    Theraband Level (Shoulder Internal Rotation)  Level 2 (Red)             PT Education - 09/20/19 1021    Education Details  Reviewed HEP    Person(s) Educated  Patient    Methods  Explanation;Tactile cues;Verbal cues    Comprehension  Verbalized understanding;Tactile cues required;Need further instruction;Verbal cues required;Returned demonstration       PT Short Term Goals - 09/06/19 1102      PT SHORT TERM GOAL #1   Title  Rhonda Burns will be independent with her starter HEP in 2  weeks.    Time  2    Period  Weeks    Status  New    Target Date  09/20/19        PT Long Term Goals - 09/06/19 1103      PT LONG TERM GOAL #1   Title  Rhonda Burns will report R shoulder pain consistently 0-2/10 on the Numeric Pain Rating Scale and will be able to get a full night's sleep.    Baseline  Very interupted sleep with 3-6/10 pain.    Time  6    Period  Weeks    Status  New    Target Date  10/18/19      PT LONG TERM GOAL #2   Title  Rhonda Burns will improve flexion AROM to 170; IR to 60; ER to 90 and horizontal adduction to 40 degrees.    Baseline  See objective.    Time  6    Period  Weeks    Status  New    Target Date  10/18/19      PT LONG TERM GOAL #3   Title  Rhonda Burns will improve her R shoulder strength to at least 4/5 for ER and IR.    Baseline  See Objective.    Time  6    Period  Weeks    Status  New    Target Date  10/18/19      PT LONG TERM GOAL #4   Title  Rhonda Burns will be independent with her long-term HEP at  DC.    Time  6    Period  Weeks    Status  New    Target Date  10/18/19            Plan - 09/20/19 1045    Clinical Impression Statement  Rhonda Burns notes great progress since evaluation.  IR AROM is progressing while ER AROM is now normal.  We will progress flexion AROM, thumb up the back (IR stretch) and continued rotator cuff and scapular strength work next visit to work towards meeting LTGs.    Personal Factors and Comorbidities  Comorbidity 1    Comorbidities  HTN    Examination-Activity Limitations  Sleep;Bed Mobility    Examination-Participation Restrictions  Church;Yard Work;Laundry;Cleaning;Meal Prep;Volunteer    Stability/Clinical Decision Making  Stable/Uncomplicated    Rehab Potential  Good    PT Frequency  2x / week    PT Duration  6 weeks    PT Treatment/Interventions  ADLs/Self Care Home Management;Cryotherapy;Therapeutic activities;Therapeutic exercise;Neuromuscular re-education;Patient/family education;Manual techniques;Passive range of motion    PT Next Visit Plan  Continue posterior capsule stretching, scapular and rotator cuff strengthening.  Motor control and coordination of R shoulder.    PT Home Exercise Plan  [phone]    Consulted and Agree with Plan of Care  Patient       Patient will benefit from skilled therapeutic intervention in order to improve the following deficits and impairments:  Decreased range of motion, Decreased mobility, Decreased strength, Pain  Visit Diagnosis: Impingement syndrome of right shoulder  Chronic right shoulder pain  Shoulder tendonitis, right     Problem List Patient Active Problem List   Diagnosis Date Noted  . Abdominal pain, chronic, right upper quadrant 09/29/2015  . Chest pain 08/06/2013  . DOE (dyspnea on exertion) 08/06/2013  . Reactive airways dysfunction syndrome ([place]) 09/27/2012  . Hypertension   . Hyperlipidemia   . Osteoporosis   . Allergic rhinitis, cause unspecified   . GERD (  gastroesophageal reflux  disease)     Farley Ly PT, MPT 09/20/2019, 11:03 AM  [facility] [address] [place], [place], [address] Phone: [phone]   Fax:  [phone]  Name: Rhonda Burns MRN: [id number] Date of Birth: 1945-08-01

## 2019-09-20 NOTE — Patient Instructions (Signed)
Continue current HEP 

## 2019-09-24 ENCOUNTER — Ambulatory Visit (INDEPENDENT_AMBULATORY_CARE_PROVIDER_SITE_OTHER): Payer: PPO | Admitting: Cardiovascular Disease

## 2019-09-24 ENCOUNTER — Encounter: Payer: Self-pay | Admitting: Cardiovascular Disease

## 2019-09-24 ENCOUNTER — Other Ambulatory Visit: Payer: Self-pay

## 2019-09-24 VITALS — BP 156/78 | HR 90 | Ht 60.0 in | Wt 133.2 lb

## 2019-09-24 DIAGNOSIS — Z8249 Family history of ischemic heart disease and other diseases of the circulatory system: Secondary | ICD-10-CM | POA: Diagnosis not present

## 2019-09-24 DIAGNOSIS — I10 Essential (primary) hypertension: Secondary | ICD-10-CM

## 2019-09-24 DIAGNOSIS — E782 Mixed hyperlipidemia: Secondary | ICD-10-CM | POA: Diagnosis not present

## 2019-09-24 NOTE — Assessment & Plan Note (Signed)
Father died of a myocardial infarction in his early 61s

## 2019-09-24 NOTE — Assessment & Plan Note (Signed)
History of essential hypertension blood pressure measured today at 156/78.  She measured her blood pressure at home and it is much lower than this.  Her antihypertensive medications were recently uptitrated.  She is on lisinopril hydrochlorothiazide.  I am going to get a renal Doppler study to further evaluate

## 2019-09-24 NOTE — Progress Notes (Signed)
09/24/2019 Rhonda Burns   Jan 05, 1946  [id number]  Primary Physician Shawnee Knapp, MD Primary Cardiologist: Lorretta Harp MD Lupe Carney, [place]  HPI:  Rhonda Burns is a 74 y.o. mildly overweight widowed African-American female mother of 1 daughter, grandmother of 1 granddaughter referred by Dr. Delman Cheadle for cardiovascular evaluation because of hypertension.  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  The cardiac risk factor profile is notable for treated hypertension hyperlipidemia.  Her family history is notable for father who died of a myocardial infarction at age 72.  She is never had a heart attack or stroke.  She did have chest pain back in 2015 at that time had a negative Myoview normal 2D echo.  Recently her blood pressures been a little bit more difficult to control and her medications have been uptitrated.   Current Meds  Medication Sig  . acetaminophen (TYLENOL) 500 MG tablet Take 500 mg by mouth every 8 (eight) hours as needed.  Marland Kitchen albuterol (VENTOLIN HFA) 108 (90 Base) MCG/ACT inhaler Inhale 2 puffs into the lungs every 6 (six) hours as needed for wheezing.  Marland Kitchen aspirin EC 81 MG tablet Take 81 mg by mouth daily.  . calcium gluconate 500 MG tablet Take 500 mg by mouth daily.  . Cholecalciferol (D3 SUPER STRENGTH) 2000 UNITS CAPS Take by mouth once.  . fluticasone (FLONASE) 50 MCG/ACT nasal spray Place 2 sprays into both nostrils daily as needed for allergies.  Marland Kitchen lisinopril-hydrochlorothiazide (ZESTORETIC) 20-25 MG tablet Take 1 tablet by mouth daily.  Marland Kitchen omeprazole (PRILOSEC) 40 MG capsule Take 1 capsule (40 mg total) by mouth daily as needed (heartburn). 30 minutes before a meal  . pravastatin (PRAVACHOL) 40 MG tablet TAKE 1 TABLET DAILY.  Marland Kitchen triamcinolone cream (KENALOG) 0.1 % Apply 1 application topically 2 (two) times daily.     Allergies  Allergen  Reactions  . Other Swelling    Pecans and coconut    Social History   Socioeconomic History  . Marital status: Widowed    Spouse name: Not on file  . Number of children: 1  . Years of education: 92  . Highest education level: Some college, no degree  Occupational History  . Occupation: retired  Tobacco Use  . Smoking status: Former Research scientist (life sciences)  . Smokeless tobacco: Never Used  Substance and Sexual Activity  . Alcohol use: No    Alcohol/week: 0.0 standard drinks  . Drug use: No  . Sexual activity: Not Currently  Other Topics Concern  . Not on file  Social History Narrative   Exercise walking 7 days per week for 1 hour   Social Determinants of Health   Financial Resource Strain:   . Difficulty of Paying Living Expenses:   Food Insecurity:   . Worried About Charity fundraiser in the Last Year:   . Arboriculturist in the Last Year:   Transportation Needs:   . Film/video editor (Medical):   Marland Kitchen Lack of Transportation (Non-Medical):   Physical Activity:   . Days of Exercise per Week:   . Minutes of Exercise per Session:   Stress:   . Feeling of Stress :   Social Connections:   . Frequency of Communication with Friends and Family:   . Frequency of Social Gatherings with Friends and Family:   .  Attends Religious Services:   . Active Member of Clubs or Organizations:   . Attends Archivist Meetings:   Marland Kitchen Marital Status:   Intimate Partner Violence:   . Fear of Current or Ex-Partner:   . Emotionally Abused:   Marland Kitchen Physically Abused:   . Sexually Abused:      Review of Systems: General: negative for chills, fever, night sweats or weight changes.  Cardiovascular: negative for chest pain, dyspnea on exertion, edema, orthopnea, palpitations, paroxysmal nocturnal dyspnea or shortness of breath Dermatological: negative for rash Respiratory: negative for cough or wheezing Urologic: negative for hematuria Abdominal: negative for nausea, vomiting, diarrhea, bright red  blood per rectum, melena, or hematemesis Neurologic: negative for visual changes, syncope, or dizziness All other systems reviewed and are otherwise negative except as noted above.    Blood pressure (!) 156/78, pulse 90, height 5' (1.524 m), weight 133 lb 3.2 oz (60.4 kg), SpO2 92 %.  General appearance: alert and no distress Neck: no adenopathy, no carotid bruit, no JVD, supple, symmetrical, trachea midline and thyroid not enlarged, symmetric, no tenderness/mass/nodules Lungs: clear to auscultation bilaterally Heart: regular rate and rhythm, S1, S2 normal, no murmur, click, rub or gallop Extremities: extremities normal, atraumatic, no cyanosis or edema Pulses: 2+ and symmetric Skin: Skin color, texture, turgor normal. No rashes or lesions Neurologic: Alert and oriented X 3, normal strength and tone. Normal symmetric reflexes. Normal coordination and gait  EKG normal sinus rhythm at 90 without ST or T wave changes.  I personally reviewed this EKG.  ASSESSMENT AND PLAN:   Family history of heart disease Father died of a myocardial infarction in his early 37s  Hypertension History of essential hypertension blood pressure measured today at 156/78.  She measured her blood pressure at home and it is much lower than this.  Her antihypertensive medications were recently uptitrated.  She is on lisinopril hydrochlorothiazide.  I am going to get a renal Doppler study to further evaluate  Hyperlipidemia History of hyperlipidemia on statin therapy with lipid profile performed 05/27/2019 revealing total cholesterol 118, LDL 51 HDL 48  Chest pain History of atypical chest pain in the past with negative Myoview stress test 09/17/2013 and normal 2D echo at that time.  She has had no subsequent pain after the work-up.      Lorretta Harp MD FACP,FACC,FAHA, [facility] 09/24/2019 2:01 PM

## 2019-09-24 NOTE — Patient Instructions (Signed)
Medication Instructions:  Your physician recommends that you continue on your current medications as directed. Please refer to the Current Medication list given to you today.  *If you need a refill on your cardiac medications before your next appointment, please call your pharmacy*   Testing/Procedures: Dr. Gwenlyn Found has requested that you have a renal artery duplex. During this test, an ultrasound is used to evaluate blood flow to the kidneys. Allow one hour for this exam. Do not eat after midnight the day before and avoid carbonated beverages. Take your medications as you usually do. -- this is done at Dr. Kennon Holter office   Dr. Gwenlyn Found has ordered a CT coronary calcium score. This test is done at [address]. This is $150 out of pocket.   Coronary CalciumScan A coronary calcium scan is an imaging test used to look for deposits of calcium and other fatty materials (plaques) in the inner lining of the blood vessels of the heart (coronary arteries). These deposits of calcium and plaques can partly clog and narrow the coronary arteries without producing any symptoms or warning signs. This puts a person at risk for a heart attack. This test can detect these deposits before symptoms develop. Tell a health care provider about:  Any allergies you have.  All medicines you are taking, including vitamins, herbs, eye drops, creams, and over-the-counter medicines.  Any problems you or family members have had with anesthetic medicines.  Any blood disorders you have.  Any surgeries you have had.  Any medical conditions you have.  Whether you are pregnant or may be pregnant. What are the risks? Generally, this is a safe procedure. However, problems may occur, including:  Harm to a pregnant woman and her unborn baby. This test involves the use of radiation. Radiation exposure can be dangerous to a pregnant woman and her unborn baby. If you are pregnant, you generally should not have  this procedure done.  Slight increase in the risk of cancer. This is because of the radiation involved in the test. What happens before the procedure? No preparation is needed for this procedure. What happens during the procedure?  You will undress and remove any jewelry around your neck or chest.  You will put on a hospital gown.  Sticky electrodes will be placed on your chest. The electrodes will be connected to an electrocardiogram (ECG) machine to record a tracing of the electrical activity of your heart.  A CT scanner will take pictures of your heart. During this time, you will be asked to lie still and hold your breath for 2-3 seconds while a picture of your heart is being taken. The procedure may vary among health care providers and hospitals. What happens after the procedure?  You can get dressed.  You can return to your normal activities.  It is up to you to get the results of your test. Ask your health care provider, or the department that is doing the test, when your results will be ready. Summary  A coronary calcium scan is an imaging test used to look for deposits of calcium and other fatty materials (plaques) in the inner lining of the blood vessels of the heart (coronary arteries).  Generally, this is a safe procedure. Tell your health care provider if you are pregnant or may be pregnant.  No preparation is needed for this procedure.  A CT scanner will take pictures of your heart.  You can return to your normal activities after the scan is  done. This information is not intended to replace advice given to you by your health care provider. Make sure you discuss any questions you have with your health care provider. Document Released: 10/08/2007 Document Revised: 02/29/2016 Document Reviewed: 02/29/2016 Elsevier Interactive Patient Education  2017 [place]: At [facility], you and your health needs are our priority.  As part of our  continuing mission to provide you with exceptional heart care, we have created designated Provider Care Teams.  These Care Teams include your primary Cardiologist (physician) and Advanced Practice Providers (APPs -  Physician Assistants and Nurse Practitioners) who all work together to provide you with the care you need, when you need it.  We recommend signing up for the patient portal called "MyChart".  Sign up information is provided on this After Visit Summary.  MyChart is used to connect with patients for Virtual Visits (Telemedicine).  Patients are able to view lab/test results, encounter notes, upcoming appointments, etc.  Non-urgent messages can be sent to your provider as well.   To learn more about what you can do with MyChart, go to [email or web address].    Your next appointment:   12 month(s)  The format for your next appointment:   In Person  Provider:   You may see Dr. Gwenlyn Found or one of the following Advanced Practice Providers on your designated Care Team:    Kerin Ransom, PA-C  [place], [place]  Coletta Memos, [place]    Other Instructions

## 2019-09-24 NOTE — Assessment & Plan Note (Signed)
History of atypical chest pain in the past with negative Myoview stress test 09/17/2013 and normal 2D echo at that time.  She has had no subsequent pain after the work-up.

## 2019-09-24 NOTE — Assessment & Plan Note (Signed)
History of hyperlipidemia on statin therapy with lipid profile performed 05/27/2019 revealing total cholesterol 118, LDL 51 HDL 48

## 2019-09-25 ENCOUNTER — Ambulatory Visit: Payer: PPO | Attending: Family Medicine | Admitting: Rehabilitative and Restorative Service Providers"

## 2019-09-25 ENCOUNTER — Encounter: Payer: Self-pay | Admitting: Rehabilitative and Restorative Service Providers"

## 2019-09-25 DIAGNOSIS — M25511 Pain in right shoulder: Secondary | ICD-10-CM | POA: Insufficient documentation

## 2019-09-25 DIAGNOSIS — M7541 Impingement syndrome of right shoulder: Secondary | ICD-10-CM | POA: Diagnosis not present

## 2019-09-25 DIAGNOSIS — M778 Other enthesopathies, not elsewhere classified: Secondary | ICD-10-CM | POA: Diagnosis not present

## 2019-09-25 DIAGNOSIS — G8929 Other chronic pain: Secondary | ICD-10-CM | POA: Diagnosis not present

## 2019-09-25 NOTE — Therapy (Addendum)
[place], [place], [address] Phone: [phone]   Fax:  [phone]  Physical Therapy Treatment/Discharge  Patient Details  Name: Rhonda Burns MRN: [id number] Date of Birth: 03/20/1946 Referring Provider (PT): Delman Cheadle   Encounter Date: 09/25/2019  PT End of Session - 09/25/19 1045    Visit Number  4    Number of Visits  12    Date for PT Re-Evaluation  10/18/19    PT Start Time  1001    PT Stop Time  1045    PT Time Calculation (min)  44 min    Activity Tolerance  Patient tolerated treatment well;No increased pain    Behavior During Therapy  WFL for tasks assessed/performed       Past Medical History:  Diagnosis Date  . Abdominal pain, chronic, right upper quadrant   . Allergic rhinitis, cause unspecified   . Allergy   . Asthma   . Cataract    growing cataracts  . GERD (gastroesophageal reflux disease)   . Hiatal hernia   . Hyperlipidemia   . Hypertension   . Osteoporosis   . Osteoporosis   . Schatzki's ring     Past Surgical History:  Procedure Laterality Date  . CESAREAN SECTION    . COLONOSCOPY      There were no vitals filed for this visit.  Subjective Assessment - 09/25/19 1002    Subjective  Rhonda Burns notes continued gains in her R shoulder pain-free range.    Pertinent History  HTN.    Limitations  Other (comment)   Activities involving shoulder IR or throwing a ball for her dog.   Patient Stated Goals  Sleep better, throw ball to dog, return to using R shoulder as normal    Currently in Pain?  No/denies    Pain Onset  More than a month ago    Pain Frequency  Occasional    Aggravating Factors   Activities involving shoulder IR.    Pain Relieving Factors  Ice and exercises.    Effect of Pain on Daily Activities  IR AROM.         [facility] PT Assessment - 09/25/19 0001      ROM / Strength   AROM / PROM / Strength  AROM;PROM      AROM   AROM Assessment Site  Shoulder     Right/Left Shoulder  Right    Right Shoulder Flexion  170 Degrees    Right Shoulder Internal Rotation  40 Degrees    Right Shoulder External Rotation  100 Degrees    Right Shoulder Horizontal  ADduction  35 Degrees                    [facility] Adult PT Treatment/Exercise - 09/25/19 0001      Exercises   Exercises  Shoulder      Shoulder Exercises: Supine   Protraction  Strengthening;Both;20 reps;Weights    Theraband Level (Shoulder Protraction)  Other (comment)    Protraction Weight (lbs)  4# DBs    Internal Rotation  AROM;20 reps   10 seconds 2 sets (with & without PT overpressure)     Shoulder Exercises: Sidelying   External Rotation  Strengthening;Right    External Rotation Weight (lbs)  2 sets of 10 with 2# and slow eccentrics      Shoulder Exercises: Standing   External Rotation  Strengthening;Both    Theraband Level (Shoulder External Rotation)  Level 2 (Red)  External Rotation Weight (lbs)  2 sets of 10 with slow eccentrics and towel under elbow    Internal Rotation  Strengthening;10 reps    Theraband Level (Shoulder Internal Rotation)  Level 2 (Red)      Shoulder Exercises: Stretch   Other Shoulder Stretches  Posterior capsule stretch 10X 10 seconds    Other Shoulder Stretches  Thumb up the back stretch 10X 10 seconds             PT Education - 09/25/19 1052    Education Details  Arayah needed feedback with her supine IR stretch and theraband strengthening activities for her rotator cuff for proper performance.    Person(s) Educated  Patient    Methods  Explanation;Demonstration;Tactile cues;Verbal cues    Comprehension  Verbalized understanding;Tactile cues required;Returned demonstration;Need further instruction;Verbal cues required       PT Short Term Goals - 09/25/19 1045      PT SHORT TERM GOAL #1   Title  Rhonda Burns will be independent with her starter HEP in 2 weeks.    Time  2    Period  Weeks    Status  Achieved    Target Date  09/20/19         PT Long Term Goals - 09/25/19 1045      PT LONG TERM GOAL #1   Title  Rhonda Burns will report R shoulder pain consistently 0-2/10 on the Numeric Pain Rating Scale and will be able to get a full night's sleep.    Baseline  Very interupted sleep with 3-6/10 pain at evaluation.  Now 0-3/10 with uninterrupted sleep.    Time  6    Period  Weeks    Status  Partially Met      PT LONG TERM GOAL #2   Title  Rhonda Burns will improve flexion AROM to 170; IR to 60; ER to 90 and horizontal adduction to 40 degrees.    Baseline  See objective for evaluation numbers.  IR and horizontal adduction AROM still need work.    Time  6    Period  Weeks    Status  Partially Met      PT LONG TERM GOAL #3   Title  Rhonda Burns will improve her R shoulder strength to at least 4/5 for ER and IR.    Baseline  See Objective.    Time  6    Period  Weeks    Status  Partially Met      PT LONG TERM GOAL #4   Title  Rhonda Burns will be independent with her long-term HEP at DC.    Time  6    Period  Weeks    Status  Partially Met            Plan - 09/25/19 1045    Clinical Impression Statement  Rhonda Burns continues to make excellent progress towards goals established at evaluation.  Sleep is now uninterrupted.  IR AROM and activities behind the back will benefit from continued IR AROM work while dog related and house chore activities will benefit from continued scapular and RTC strengthening.    Personal Factors and Comorbidities  Comorbidity 1    Comorbidities  HTN    Examination-Activity Limitations  Sleep;Bed Mobility    Examination-Participation Restrictions  Church;Yard Work;Laundry;Cleaning;Meal Prep;Volunteer    Stability/Clinical Decision Making  Stable/Uncomplicated    Rehab Potential  Good    PT Frequency  2x / week    PT Duration  6 weeks  PT Treatment/Interventions  ADLs/Self Care Home Management;Cryotherapy;Therapeutic activities;Therapeutic exercise;Neuromuscular re-education;Patient/family education;Manual  techniques;Passive range of motion    PT Next Visit Plan  Continue posterior capsule stretching, scapular and rotator cuff strengthening.  Motor control and coordination of R shoulder.    PT Home Exercise Plan  [phone]    Consulted and Agree with Plan of Care  Patient       Patient will benefit from skilled therapeutic intervention in order to improve the following deficits and impairments:  Decreased range of motion, Decreased mobility, Decreased strength, Pain  Visit Diagnosis: Impingement syndrome of right shoulder  Chronic right shoulder pain  Shoulder tendonitis, right     Problem List Patient Active Problem List   Diagnosis Date Noted  . Family history of heart disease 09/24/2019  . Abdominal pain, chronic, right upper quadrant 09/29/2015  . Chest pain 08/06/2013  . DOE (dyspnea on exertion) 08/06/2013  . Reactive airways dysfunction syndrome ([place]) 09/27/2012  . Hypertension   . Hyperlipidemia   . Osteoporosis   . Allergic rhinitis, cause unspecified   . GERD (gastroesophageal reflux disease)     Farley Ly PT, MPT 09/25/2019, 11:48 AM  [facility] [address] [place], [place], [address] Phone: [phone]   Fax:  [phone]  Name: Taylor Spilde MRN: [id number] Date of Birth: November 24, 1945  PHYSICAL THERAPY DISCHARGE SUMMARY  Visits from Start of Care: 4  Current functional level related to goals / functional outcomes: See above   Remaining deficits: See above   Education / Equipment: Anatomy of condition, POC, HEP, exercise form/rationale  Plan: Patient agrees to discharge.  Patient goals were not met. Patient is being discharged due to not returning since the last visit.  ?????     Jessica C. Hightower PT, DPT 12/24/19 6:34 PM

## 2019-09-25 NOTE — Patient Instructions (Signed)
Access Code: [email or web address] URL: [email or web address] Date: 09/25/2019 Prepared by: Vista Mink  Exercises Standing Shoulder Posterior Capsule Stretch - 1 x daily - 7 x weekly - 1 sets - 10 reps - 10 hold Standing Shoulder Internal Rotation Stretch with Hands Behind Back - 2 x daily - 7 x weekly - 1 sets - 10 reps - 10 hold

## 2019-09-27 ENCOUNTER — Ambulatory Visit (HOSPITAL_COMMUNITY)
Admission: RE | Admit: 2019-09-27 | Discharge: 2019-09-27 | Disposition: A | Discharge status: Home / Self Care | Payer: PPO | Source: Ambulatory Visit | Attending: Cardiovascular Disease | Admitting: Cardiovascular Disease

## 2019-09-27 ENCOUNTER — Other Ambulatory Visit: Payer: Self-pay

## 2019-09-27 DIAGNOSIS — Z8249 Family history of ischemic heart disease and other diseases of the circulatory system: Secondary | ICD-10-CM | POA: Insufficient documentation

## 2019-09-27 DIAGNOSIS — Z87891 Personal history of nicotine dependence: Secondary | ICD-10-CM | POA: Insufficient documentation

## 2019-09-27 DIAGNOSIS — I10 Essential (primary) hypertension: Secondary | ICD-10-CM | POA: Diagnosis not present

## 2019-09-27 DIAGNOSIS — E785 Hyperlipidemia, unspecified: Secondary | ICD-10-CM | POA: Diagnosis not present

## 2019-09-30 ENCOUNTER — Other Ambulatory Visit: Payer: Self-pay

## 2019-09-30 ENCOUNTER — Other Ambulatory Visit: Payer: Self-pay | Admitting: Cardiovascular Disease

## 2019-09-30 ENCOUNTER — Ambulatory Visit (INDEPENDENT_AMBULATORY_CARE_PROVIDER_SITE_OTHER)
Admission: RE | Admit: 2019-09-30 | Discharge: 2019-09-30 | Disposition: A | Discharge status: Home / Self Care | Payer: PPO | Source: Ambulatory Visit | Attending: Cardiovascular Disease | Admitting: Cardiovascular Disease

## 2019-09-30 DIAGNOSIS — Z8249 Family history of ischemic heart disease and other diseases of the circulatory system: Secondary | ICD-10-CM

## 2019-10-02 ENCOUNTER — Encounter: Payer: Self-pay | Admitting: *Deleted

## 2019-10-02 ENCOUNTER — Telehealth: Payer: Self-pay | Admitting: Emergency Medicine

## 2019-10-02 NOTE — Telephone Encounter (Signed)
This encounter was created in error - please disregard.

## 2019-10-02 NOTE — Telephone Encounter (Signed)
-----   Message from Lorretta Harp, MD sent at 09/28/2019  6:52 PM EDT ----- No evid of RAS

## 2019-10-02 NOTE — Telephone Encounter (Signed)
Discussed CT results with patient.

## 2019-10-02 NOTE — Telephone Encounter (Signed)
Patient states she had a CT Coronary Calcium Score Test. She states she understands that the results are in MyChart but would like a better understanding of what the words mean.

## 2019-10-02 NOTE — Telephone Encounter (Signed)
-----   Message from Lorretta Harp, MD sent at 10/01/2019  4:38 PM EDT ----- Coronary calcium score of 0.  No evidence of CAD.

## 2019-10-08 ENCOUNTER — Encounter: Payer: PPO | Admitting: Rehabilitative and Restorative Service Providers"

## 2019-11-29 ENCOUNTER — Ambulatory Visit: Payer: PPO | Admitting: Family Medicine

## 2020-01-09 DIAGNOSIS — K635 Polyp of colon: Secondary | ICD-10-CM | POA: Diagnosis not present

## 2020-01-09 DIAGNOSIS — K219 Gastro-esophageal reflux disease without esophagitis: Secondary | ICD-10-CM | POA: Diagnosis not present

## 2020-01-09 DIAGNOSIS — I10 Essential (primary) hypertension: Secondary | ICD-10-CM | POA: Diagnosis not present

## 2020-01-09 DIAGNOSIS — M160 Bilateral primary osteoarthritis of hip: Secondary | ICD-10-CM | POA: Diagnosis not present

## 2020-01-09 DIAGNOSIS — N734 Female chronic pelvic peritonitis: Secondary | ICD-10-CM | POA: Diagnosis not present

## 2020-01-09 DIAGNOSIS — J668 Airway disease due to other specific organic dusts: Secondary | ICD-10-CM | POA: Diagnosis not present

## 2020-01-09 DIAGNOSIS — H9193 Unspecified hearing loss, bilateral: Secondary | ICD-10-CM | POA: Diagnosis not present

## 2020-01-09 DIAGNOSIS — M19011 Primary osteoarthritis, right shoulder: Secondary | ICD-10-CM | POA: Diagnosis not present

## 2020-01-21 DIAGNOSIS — Z1231 Encounter for screening mammogram for malignant neoplasm of breast: Secondary | ICD-10-CM | POA: Diagnosis not present

## 2020-01-24 ENCOUNTER — Ambulatory Visit: Payer: PPO | Admitting: Dietician

## 2020-02-07 DIAGNOSIS — H35032 Hypertensive retinopathy, left eye: Secondary | ICD-10-CM | POA: Diagnosis not present

## 2020-02-07 DIAGNOSIS — H43813 Vitreous degeneration, bilateral: Secondary | ICD-10-CM | POA: Diagnosis not present

## 2020-02-07 DIAGNOSIS — H10413 Chronic giant papillary conjunctivitis, bilateral: Secondary | ICD-10-CM | POA: Diagnosis not present

## 2020-02-07 DIAGNOSIS — H2513 Age-related nuclear cataract, bilateral: Secondary | ICD-10-CM | POA: Diagnosis not present

## 2020-02-07 DIAGNOSIS — H40013 Open angle with borderline findings, low risk, bilateral: Secondary | ICD-10-CM | POA: Diagnosis not present

## 2020-03-11 DIAGNOSIS — H10413 Chronic giant papillary conjunctivitis, bilateral: Secondary | ICD-10-CM | POA: Diagnosis not present

## 2020-03-11 DIAGNOSIS — H43813 Vitreous degeneration, bilateral: Secondary | ICD-10-CM | POA: Diagnosis not present

## 2020-03-11 DIAGNOSIS — H40013 Open angle with borderline findings, low risk, bilateral: Secondary | ICD-10-CM | POA: Diagnosis not present

## 2020-03-11 DIAGNOSIS — H2513 Age-related nuclear cataract, bilateral: Secondary | ICD-10-CM | POA: Diagnosis not present

## 2020-03-11 DIAGNOSIS — H35032 Hypertensive retinopathy, left eye: Secondary | ICD-10-CM | POA: Diagnosis not present

## 2020-07-09 DIAGNOSIS — K635 Polyp of colon: Secondary | ICD-10-CM | POA: Diagnosis not present

## 2020-07-09 DIAGNOSIS — J45909 Unspecified asthma, uncomplicated: Secondary | ICD-10-CM | POA: Diagnosis not present

## 2020-07-09 DIAGNOSIS — K219 Gastro-esophageal reflux disease without esophagitis: Secondary | ICD-10-CM | POA: Diagnosis not present

## 2020-07-09 DIAGNOSIS — M19011 Primary osteoarthritis, right shoulder: Secondary | ICD-10-CM | POA: Diagnosis not present

## 2020-07-09 DIAGNOSIS — H9193 Unspecified hearing loss, bilateral: Secondary | ICD-10-CM | POA: Diagnosis not present

## 2020-07-09 DIAGNOSIS — N734 Female chronic pelvic peritonitis: Secondary | ICD-10-CM | POA: Diagnosis not present

## 2020-07-09 DIAGNOSIS — M160 Bilateral primary osteoarthritis of hip: Secondary | ICD-10-CM | POA: Diagnosis not present

## 2020-07-09 DIAGNOSIS — Z0001 Encounter for general adult medical examination with abnormal findings: Secondary | ICD-10-CM | POA: Diagnosis not present

## 2020-07-09 DIAGNOSIS — Z136 Encounter for screening for cardiovascular disorders: Secondary | ICD-10-CM | POA: Diagnosis not present

## 2020-07-09 DIAGNOSIS — J668 Airway disease due to other specific organic dusts: Secondary | ICD-10-CM | POA: Diagnosis not present

## 2020-07-09 DIAGNOSIS — I10 Essential (primary) hypertension: Secondary | ICD-10-CM | POA: Diagnosis not present

## 2020-07-09 DIAGNOSIS — Z113 Encounter for screening for infections with a predominantly sexual mode of transmission: Secondary | ICD-10-CM | POA: Diagnosis not present

## 2020-07-10 DIAGNOSIS — R7303 Prediabetes: Secondary | ICD-10-CM | POA: Diagnosis not present

## 2020-07-10 DIAGNOSIS — E782 Mixed hyperlipidemia: Secondary | ICD-10-CM | POA: Diagnosis not present

## 2020-07-10 DIAGNOSIS — I10 Essential (primary) hypertension: Secondary | ICD-10-CM | POA: Diagnosis not present

## 2020-07-10 DIAGNOSIS — R413 Other amnesia: Secondary | ICD-10-CM | POA: Diagnosis not present

## 2020-07-28 DIAGNOSIS — M160 Bilateral primary osteoarthritis of hip: Secondary | ICD-10-CM | POA: Diagnosis not present

## 2020-07-28 DIAGNOSIS — M19011 Primary osteoarthritis, right shoulder: Secondary | ICD-10-CM | POA: Diagnosis not present

## 2020-07-28 DIAGNOSIS — L530 Toxic erythema: Secondary | ICD-10-CM | POA: Diagnosis not present

## 2020-07-28 DIAGNOSIS — J45909 Unspecified asthma, uncomplicated: Secondary | ICD-10-CM | POA: Diagnosis not present

## 2020-07-28 DIAGNOSIS — J668 Airway disease due to other specific organic dusts: Secondary | ICD-10-CM | POA: Diagnosis not present

## 2020-07-28 DIAGNOSIS — K635 Polyp of colon: Secondary | ICD-10-CM | POA: Diagnosis not present

## 2020-07-28 DIAGNOSIS — K219 Gastro-esophageal reflux disease without esophagitis: Secondary | ICD-10-CM | POA: Diagnosis not present

## 2020-07-28 DIAGNOSIS — Z712 Person consulting for explanation of examination or test findings: Secondary | ICD-10-CM | POA: Diagnosis not present

## 2020-07-28 DIAGNOSIS — N734 Female chronic pelvic peritonitis: Secondary | ICD-10-CM | POA: Diagnosis not present

## 2020-07-28 DIAGNOSIS — I10 Essential (primary) hypertension: Secondary | ICD-10-CM | POA: Diagnosis not present

## 2020-07-28 DIAGNOSIS — H9193 Unspecified hearing loss, bilateral: Secondary | ICD-10-CM | POA: Diagnosis not present

## 2020-07-30 DIAGNOSIS — H9313 Tinnitus, bilateral: Secondary | ICD-10-CM | POA: Diagnosis not present

## 2020-07-30 DIAGNOSIS — H903 Sensorineural hearing loss, bilateral: Secondary | ICD-10-CM | POA: Diagnosis not present

## 2020-07-30 DIAGNOSIS — Z822 Family history of deafness and hearing loss: Secondary | ICD-10-CM | POA: Diagnosis not present

## 2020-09-30 ENCOUNTER — Ambulatory Visit (INDEPENDENT_AMBULATORY_CARE_PROVIDER_SITE_OTHER): Payer: PPO | Admitting: Cardiovascular Disease

## 2020-09-30 ENCOUNTER — Encounter: Payer: Self-pay | Admitting: Cardiovascular Disease

## 2020-09-30 ENCOUNTER — Other Ambulatory Visit: Payer: Self-pay

## 2020-09-30 DIAGNOSIS — E782 Mixed hyperlipidemia: Secondary | ICD-10-CM

## 2020-09-30 DIAGNOSIS — I10 Essential (primary) hypertension: Secondary | ICD-10-CM | POA: Diagnosis not present

## 2020-09-30 NOTE — Patient Instructions (Signed)
Medication Instructions:  Your physician recommends that you continue on your current medications as directed. Please refer to the Current Medication list given to you today.  *If you need a refill on your cardiac medications before your next appointment, please call your pharmacy*   Follow-Up: At [facility], you and your health needs are our priority.  As part of our continuing mission to provide you with exceptional heart care, we have created designated Provider Care Teams.  These Care Teams include your primary Cardiologist (physician) and Advanced Practice Providers (APPs -  Physician Assistants and Nurse Practitioners) who all work together to provide you with the care you need, when you need it.  We recommend signing up for the patient portal called "MyChart".  Sign up information is provided on this After Visit Summary.  MyChart is used to connect with patients for Virtual Visits (Telemedicine).  Patients are able to view lab/test results, encounter notes, upcoming appointments, etc.  Non-urgent messages can be sent to your provider as well.   To learn more about what you can do with MyChart, go to [email or web address].    Your next appointment:   AS NEEDED with Dr. Berry 

## 2020-09-30 NOTE — Assessment & Plan Note (Signed)
History of hyperlipidemia on statin therapy with lipid profile performed 05/27/2019 revealing total cholesterol 118, LDL 51 and HDL 48.

## 2020-09-30 NOTE — Assessment & Plan Note (Signed)
History of essential hypertension a blood pressure measured today 114/66.  She is on lisinopril and hydrochlorothiazide which she took this morning.  For the most part her blood pressures are well controlled.  She did have renal Doppler studies performed 09/27/2019 that showed no evidence of renal artery stenosis.

## 2020-09-30 NOTE — Progress Notes (Signed)
09/30/2020 Rhonda Burns   Jan 30, 1946  [id number]  Primary Physician Shawnee Knapp, MD Primary Cardiologist: Lorretta Harp MD Lupe Carney, [place]  HPI:  Rhonda Burns is a 75 y.o.   mildly overweight widowed African-American female mother of 1 daughter, grandmother of 1 granddaughter referred by Dr. Delman Cheadle for cardiovascular evaluation because of hypertension.    I last saw her in the office 09/24/2019.  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  The cardiac risk factor profile is notable for treated hypertension hyperlipidemia.  Her family history is notable for father who died of a myocardial infarction at age 37.  She is never had a heart attack or stroke.  She did have chest pain back in 2015 at that time had a negative Myoview normal 2D echo.    Since I saw her a year ago she did have renal Doppler studies performed 09/27/2019 that showed no evidence of renal artery stenosis.  She walks her poodle 3 times a day without symptoms.  Her blood pressure has been under good control on her current medications.  She denies chest pain or shortness of breath.  Current Meds  Medication Sig  . acetaminophen (TYLENOL) 500 MG tablet Take 500 mg by mouth every 8 (eight) hours as needed.  Marland Kitchen albuterol (VENTOLIN HFA) 108 (90 Base) MCG/ACT inhaler Inhale 2 puffs into the lungs every 6 (six) hours as needed for wheezing.  Marland Kitchen aspirin EC 81 MG tablet Take 81 mg by mouth daily.  . calcium gluconate 500 MG tablet Take 500 mg by mouth daily.  . Cholecalciferol 50 MCG (2000 UT) CAPS Take by mouth once.  . fluticasone (FLONASE) 50 MCG/ACT nasal spray Place into both nostrils daily.  Marland Kitchen lisinopril-hydrochlorothiazide (ZESTORETIC) 20-25 MG tablet Take 1 tablet by mouth daily.  . pravastatin (PRAVACHOL) 40 MG tablet TAKE 1 TABLET DAILY.  Marland Kitchen triamcinolone cream (KENALOG) 0.1 % Apply 1 application  topically 2 (two) times daily.  . [DISCONTINUED] fluticasone (FLONASE) 50 MCG/ACT nasal spray Place 2 sprays into both nostrils daily as needed for allergies.  . [DISCONTINUED] omeprazole (PRILOSEC) 40 MG capsule Take 1 capsule (40 mg total) by mouth daily as needed (heartburn). 30 minutes before a meal     Allergies  Allergen Reactions  . Other Swelling    Pecans and coconut    Social History   Socioeconomic History  . Marital status: Widowed    Spouse name: Not on file  . Number of children: 1  . Years of education: 17  . Highest education level: Some college, no degree  Occupational History  . Occupation: retired  Tobacco Use  . Smoking status: Former Research scientist (life sciences)  . Smokeless tobacco: Never Used  Vaping Use  . Vaping Use: Never used  Substance and Sexual Activity  . Alcohol use: No    Alcohol/week: 0.0 standard drinks  . Drug use: No  . Sexual activity: Not Currently  Other Topics Concern  . Not on file  Social History Narrative   Exercise walking 7 days per week for 1 hour   Social Determinants of Health   Financial Resource Strain: Not on file  Food Insecurity: Not on file  Transportation Needs: Not on file  Physical Activity: Not on file  Stress: Not on file  Social Connections: Not on file  Intimate Partner Violence: Not  on file     Review of Systems: General: negative for chills, fever, night sweats or weight changes.  Cardiovascular: negative for chest pain, dyspnea on exertion, edema, orthopnea, palpitations, paroxysmal nocturnal dyspnea or shortness of breath Dermatological: negative for rash Respiratory: negative for cough or wheezing Urologic: negative for hematuria Abdominal: negative for nausea, vomiting, diarrhea, bright red blood per rectum, melena, or hematemesis Neurologic: negative for visual changes, syncope, or dizziness All other systems reviewed and are otherwise negative except as noted above.    Blood pressure 114/66, pulse 63, height 5'  (1.524 m), weight 130 lb (59 kg).  General appearance: alert and no distress Neck: no adenopathy, no carotid bruit, no JVD, supple, symmetrical, trachea midline and thyroid not enlarged, symmetric, no tenderness/mass/nodules Lungs: clear to auscultation bilaterally Heart: regular rate and rhythm, S1, S2 normal, no murmur, click, rub or gallop Extremities: extremities normal, atraumatic, no cyanosis or edema Pulses: 2+ and symmetric Skin: Skin color, texture, turgor normal. No rashes or lesions Neurologic: Alert and oriented X 3, normal strength and tone. Normal symmetric reflexes. Normal coordination and gait  EKG sinus rhythm at 63 without ST or T wave changes.  I personally reviewed this EKG.  ASSESSMENT AND PLAN:   Hypertension History of essential hypertension a blood pressure measured today 114/66.  She is on lisinopril and hydrochlorothiazide which she took this morning.  For the most part her blood pressures are well controlled.  She did have renal Doppler studies performed 09/27/2019 that showed no evidence of renal artery stenosis.  Hyperlipidemia History of hyperlipidemia on statin therapy with lipid profile performed 05/27/2019 revealing total cholesterol 118, LDL 51 and HDL 48.      Lorretta Harp MD FACP,FACC,FAHA, [facility] 09/30/2020 11:47 AM

## 2021-01-12 DIAGNOSIS — H9193 Unspecified hearing loss, bilateral: Secondary | ICD-10-CM | POA: Diagnosis not present

## 2021-01-12 DIAGNOSIS — Z0001 Encounter for general adult medical examination with abnormal findings: Secondary | ICD-10-CM | POA: Diagnosis not present

## 2021-01-12 DIAGNOSIS — R7303 Prediabetes: Secondary | ICD-10-CM | POA: Diagnosis not present

## 2021-01-12 DIAGNOSIS — Z79899 Other long term (current) drug therapy: Secondary | ICD-10-CM | POA: Diagnosis not present

## 2021-01-12 DIAGNOSIS — Z136 Encounter for screening for cardiovascular disorders: Secondary | ICD-10-CM | POA: Diagnosis not present

## 2021-01-12 DIAGNOSIS — M160 Bilateral primary osteoarthritis of hip: Secondary | ICD-10-CM | POA: Diagnosis not present

## 2021-01-12 DIAGNOSIS — K219 Gastro-esophageal reflux disease without esophagitis: Secondary | ICD-10-CM | POA: Diagnosis not present

## 2021-01-12 DIAGNOSIS — M19011 Primary osteoarthritis, right shoulder: Secondary | ICD-10-CM | POA: Diagnosis not present

## 2021-01-12 DIAGNOSIS — J668 Airway disease due to other specific organic dusts: Secondary | ICD-10-CM | POA: Diagnosis not present

## 2021-01-12 DIAGNOSIS — N734 Female chronic pelvic peritonitis: Secondary | ICD-10-CM | POA: Diagnosis not present

## 2021-01-12 DIAGNOSIS — E782 Mixed hyperlipidemia: Secondary | ICD-10-CM | POA: Diagnosis not present

## 2021-01-12 DIAGNOSIS — Z1331 Encounter for screening for depression: Secondary | ICD-10-CM | POA: Diagnosis not present

## 2021-01-12 DIAGNOSIS — I10 Essential (primary) hypertension: Secondary | ICD-10-CM | POA: Diagnosis not present

## 2021-01-12 DIAGNOSIS — Z113 Encounter for screening for infections with a predominantly sexual mode of transmission: Secondary | ICD-10-CM | POA: Diagnosis not present

## 2021-01-12 DIAGNOSIS — K635 Polyp of colon: Secondary | ICD-10-CM | POA: Diagnosis not present

## 2021-01-21 DIAGNOSIS — Z1231 Encounter for screening mammogram for malignant neoplasm of breast: Secondary | ICD-10-CM | POA: Diagnosis not present

## 2021-02-02 DIAGNOSIS — M8589 Other specified disorders of bone density and structure, multiple sites: Secondary | ICD-10-CM | POA: Diagnosis not present

## 2021-02-09 DIAGNOSIS — H2513 Age-related nuclear cataract, bilateral: Secondary | ICD-10-CM | POA: Diagnosis not present

## 2021-02-09 DIAGNOSIS — H10413 Chronic giant papillary conjunctivitis, bilateral: Secondary | ICD-10-CM | POA: Diagnosis not present

## 2021-02-09 DIAGNOSIS — H40013 Open angle with borderline findings, low risk, bilateral: Secondary | ICD-10-CM | POA: Diagnosis not present

## 2021-02-09 DIAGNOSIS — H35032 Hypertensive retinopathy, left eye: Secondary | ICD-10-CM | POA: Diagnosis not present

## 2021-02-09 DIAGNOSIS — H43813 Vitreous degeneration, bilateral: Secondary | ICD-10-CM | POA: Diagnosis not present

## 2022-05-03 DIAGNOSIS — H25811 Combined forms of age-related cataract, right eye: Secondary | ICD-10-CM | POA: Diagnosis not present

## 2022-05-03 DIAGNOSIS — H25812 Combined forms of age-related cataract, left eye: Secondary | ICD-10-CM | POA: Diagnosis not present

## 2022-05-19 DIAGNOSIS — H269 Unspecified cataract: Secondary | ICD-10-CM | POA: Diagnosis not present

## 2022-05-19 DIAGNOSIS — H25812 Combined forms of age-related cataract, left eye: Secondary | ICD-10-CM | POA: Diagnosis not present

## 2022-06-09 DIAGNOSIS — H269 Unspecified cataract: Secondary | ICD-10-CM | POA: Diagnosis not present

## 2022-06-09 DIAGNOSIS — H25811 Combined forms of age-related cataract, right eye: Secondary | ICD-10-CM | POA: Diagnosis not present

## 2022-07-06 DIAGNOSIS — E559 Vitamin D deficiency, unspecified: Secondary | ICD-10-CM | POA: Diagnosis not present

## 2022-07-06 DIAGNOSIS — E1165 Type 2 diabetes mellitus with hyperglycemia: Secondary | ICD-10-CM | POA: Diagnosis not present

## 2022-07-06 DIAGNOSIS — I10 Essential (primary) hypertension: Secondary | ICD-10-CM | POA: Diagnosis not present

## 2022-07-06 DIAGNOSIS — E782 Mixed hyperlipidemia: Secondary | ICD-10-CM | POA: Diagnosis not present

## 2022-07-13 DIAGNOSIS — E1165 Type 2 diabetes mellitus with hyperglycemia: Secondary | ICD-10-CM | POA: Diagnosis not present

## 2022-07-13 DIAGNOSIS — E782 Mixed hyperlipidemia: Secondary | ICD-10-CM | POA: Diagnosis not present

## 2022-07-13 DIAGNOSIS — E559 Vitamin D deficiency, unspecified: Secondary | ICD-10-CM | POA: Diagnosis not present

## 2022-07-13 DIAGNOSIS — I10 Essential (primary) hypertension: Secondary | ICD-10-CM | POA: Diagnosis not present

## 2022-08-08 DIAGNOSIS — Z87891 Personal history of nicotine dependence: Secondary | ICD-10-CM | POA: Diagnosis not present

## 2022-08-08 DIAGNOSIS — G8929 Other chronic pain: Secondary | ICD-10-CM | POA: Diagnosis not present

## 2022-08-08 DIAGNOSIS — J309 Allergic rhinitis, unspecified: Secondary | ICD-10-CM | POA: Diagnosis not present

## 2022-08-08 DIAGNOSIS — E785 Hyperlipidemia, unspecified: Secondary | ICD-10-CM | POA: Diagnosis not present

## 2022-08-08 DIAGNOSIS — Z7982 Long term (current) use of aspirin: Secondary | ICD-10-CM | POA: Diagnosis not present

## 2022-08-08 DIAGNOSIS — E663 Overweight: Secondary | ICD-10-CM | POA: Diagnosis not present

## 2022-08-08 DIAGNOSIS — E1169 Type 2 diabetes mellitus with other specified complication: Secondary | ICD-10-CM | POA: Diagnosis not present

## 2022-08-08 DIAGNOSIS — J45909 Unspecified asthma, uncomplicated: Secondary | ICD-10-CM | POA: Diagnosis not present

## 2022-08-08 DIAGNOSIS — Z9849 Cataract extraction status, unspecified eye: Secondary | ICD-10-CM | POA: Diagnosis not present

## 2022-08-08 DIAGNOSIS — I10 Essential (primary) hypertension: Secondary | ICD-10-CM | POA: Diagnosis not present

## 2022-10-11 DIAGNOSIS — I10 Essential (primary) hypertension: Secondary | ICD-10-CM | POA: Diagnosis not present

## 2022-10-11 DIAGNOSIS — R7303 Prediabetes: Secondary | ICD-10-CM | POA: Diagnosis not present

## 2022-10-11 DIAGNOSIS — E782 Mixed hyperlipidemia: Secondary | ICD-10-CM | POA: Diagnosis not present

## 2022-10-11 DIAGNOSIS — E559 Vitamin D deficiency, unspecified: Secondary | ICD-10-CM | POA: Diagnosis not present

## 2022-10-17 DIAGNOSIS — I10 Essential (primary) hypertension: Secondary | ICD-10-CM | POA: Diagnosis not present

## 2022-10-17 DIAGNOSIS — R7303 Prediabetes: Secondary | ICD-10-CM | POA: Diagnosis not present

## 2022-10-17 DIAGNOSIS — E559 Vitamin D deficiency, unspecified: Secondary | ICD-10-CM | POA: Diagnosis not present

## 2022-10-17 DIAGNOSIS — E782 Mixed hyperlipidemia: Secondary | ICD-10-CM | POA: Diagnosis not present

## 2022-10-17 DIAGNOSIS — J309 Allergic rhinitis, unspecified: Secondary | ICD-10-CM | POA: Diagnosis not present

## 2023-01-09 DIAGNOSIS — Z1339 Encounter for screening examination for other mental health and behavioral disorders: Secondary | ICD-10-CM | POA: Diagnosis not present

## 2023-01-09 DIAGNOSIS — Z23 Encounter for immunization: Secondary | ICD-10-CM | POA: Diagnosis not present

## 2023-01-09 DIAGNOSIS — I10 Essential (primary) hypertension: Secondary | ICD-10-CM | POA: Diagnosis not present

## 2023-01-09 DIAGNOSIS — Z0000 Encounter for general adult medical examination without abnormal findings: Secondary | ICD-10-CM | POA: Diagnosis not present

## 2023-02-02 DIAGNOSIS — Z1231 Encounter for screening mammogram for malignant neoplasm of breast: Secondary | ICD-10-CM | POA: Diagnosis not present

## 2023-02-20 DIAGNOSIS — E1165 Type 2 diabetes mellitus with hyperglycemia: Secondary | ICD-10-CM | POA: Diagnosis not present

## 2023-02-20 DIAGNOSIS — I10 Essential (primary) hypertension: Secondary | ICD-10-CM | POA: Diagnosis not present

## 2023-02-20 DIAGNOSIS — E119 Type 2 diabetes mellitus without complications: Secondary | ICD-10-CM | POA: Diagnosis not present

## 2023-08-02 ENCOUNTER — Encounter: Payer: Self-pay | Admitting: Cardiovascular Disease

## 2023-08-02 ENCOUNTER — Ambulatory Visit: Attending: Cardiovascular Disease | Admitting: Cardiovascular Disease

## 2023-08-02 VITALS — BP 140/54 | HR 78 | Ht 60.0 in | Wt 123.0 lb

## 2023-08-02 DIAGNOSIS — I10 Essential (primary) hypertension: Secondary | ICD-10-CM | POA: Diagnosis not present

## 2023-08-02 DIAGNOSIS — E782 Mixed hyperlipidemia: Secondary | ICD-10-CM | POA: Insufficient documentation

## 2023-08-02 NOTE — Assessment & Plan Note (Signed)
 History of hyperlipidemia on statin therapy with a coronary calcium score of 0.  Her most recent lipid profile performed 06/30/2019 revealed a total cholesterol 147, LDL of 66 and HDL of 58

## 2023-08-02 NOTE — Patient Instructions (Signed)
 Medication Instructions:  NO CHANGES  *If you need a refill on your cardiac medications before your next appointment, please call your pharmacy*  Follow-Up: At [facility], you and your health needs are our priority.  As part of our continuing mission to provide you with exceptional heart care, our providers are all part of one team.  This team includes your primary Cardiologist (physician) and Advanced Practice Providers or APPs (Physician Assistants and Nurse Practitioners) who all work together to provide you with the care you need, when you need it.  Your next appointment:   12 months with Dr. Allyson Sabal  We recommend signing up for the patient portal called "MyChart".  Sign up information is provided on this After Visit Summary.  MyChart is used to connect with patients for Virtual Visits (Telemedicine).  Patients are able to view lab/test results, encounter notes, upcoming appointments, etc.  Non-urgent messages can be sent to your provider as well.   To learn more about what you can do with MyChart, go to [email or web address].     1st Floor: - Lobby - Registration  - Pharmacy  - Lab - Cafe  2nd Floor: - PV Lab - Diagnostic Testing (echo, CT, nuclear med)  3rd Floor: - Vacant  4th Floor: - TCTS (cardiothoracic surgery) - AFib Clinic - Structural Heart Clinic - Vascular Surgery  - Vascular Ultrasound  5th Floor: - HeartCare Cardiology (general and EP) - Clinical Pharmacy for coumadin, hypertension, lipid, weight-loss medications, and med management appointments    Valet parking services will be available as well.

## 2023-08-02 NOTE — Progress Notes (Signed)
 08/02/2023 Rhonda Burns   07-Aug-1945  [id number]  Primary Physician Sherren Mocha, MD Primary Cardiologist: Runell Gess MD Rhonda Burns  HPI:  Rhonda Burns is a 78 y.o.    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 Norberto Sorenson for cardiovascular evaluation because of hypertension.    I last saw her in the office 09/24/2019.  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  The cardiac risk factor profile is notable for treated hypertension hyperlipidemia.  Her family history is notable for father who died of a myocardial infarction at age 34.  She is never had a heart attack or stroke.  She did have chest pain back in 2015 at that time had a negative Myoview normal 2D echo.     Since I saw her in the office 3 years ago she continues to do well.  She is active and walks her dog several times a day.  She denies chest pain or shortness of breath.  She did have a coronary calcium score performed 09/30/2019 which was 0.   Current Meds  Medication Sig   acetaminophen (TYLENOL) 500 MG tablet Take 500 mg by mouth every 8 (eight) hours as needed.   aspirin EC 81 MG tablet Take 81 mg by mouth daily.   calcium gluconate 500 MG tablet Take 500 mg by mouth daily.   Cholecalciferol 50 MCG (2000 UT) CAPS Take by mouth once.   fluticasone (FLONASE) 50 MCG/ACT nasal spray Place into both nostrils daily.   metFORMIN (GLUCOPHAGE) 500 MG tablet Take 250 mg by mouth every morning.   pravastatin (PRAVACHOL) 40 MG tablet TAKE 1 TABLET DAILY.   triamcinolone cream (KENALOG) 0.1 % Apply 1 application topically 2 (two) times daily.   valsartan-hydrochlorothiazide (DIOVAN-HCT) 160-25 MG tablet Take 1 tablet by mouth daily.     Allergies   Allergen Reactions   Other Swelling    Pecans and coconut    Social History   Socioeconomic History   Marital status: Widowed    Spouse name: Not on file   Number of children: 1   Years of education: 54   Highest education level: Some college, no degree  Occupational History   Occupation: retired  Tobacco Use   Smoking status: Former   Smokeless tobacco: Never  Advertising account planner   Vaping status: Never Used  Substance and Sexual Activity   Alcohol use: No    Alcohol/week: 0.0 standard drinks of alcohol   Drug use: No   Sexual activity: Not Currently  Other Topics Concern   Not on file  Social History Narrative   Exercise walking 7 days per week for 1 hour   Social Drivers of Health   Financial Resource Strain: Low Risk  (03/10/2017)   Overall Financial Resource Strain (CARDIA)    Difficulty of Paying Living Expenses: Not hard at all  Food Insecurity: No Food Insecurity (03/10/2017)   Hunger Vital Sign    Worried About Running Out of Food in the Last Year: Never true    Ran Out of Food in the Last Year: Never true  Transportation Needs: No Transportation Needs (03/10/2017)   PRAPARE - Administrator, Civil Service (Medical):  No    Lack of Transportation (Non-Medical): No  Physical Activity: Sufficiently Active (03/10/2017)   Exercise Vital Sign    Days of Exercise per Week: 7 days    Minutes of Exercise per Session: 120 min  Stress: No Stress Concern Present (03/10/2017)   [employer] of Occupational Health - Occupational Stress Questionnaire    Feeling of Stress : Not at all  Social Connections: Moderately Integrated (03/10/2017)   Social Connection and Isolation Panel [NHANES]    Frequency of Communication with Friends and Family: More than three times a week    Frequency of Social Gatherings with Friends and Family: More than three times a week    Attends Religious Services: More than 4 times per year    Active Member of [employer] or Organizations: Yes     Attends Banker Meetings: More than 4 times per year    Marital Status: Widowed  Intimate Partner Violence: Not At Risk (03/10/2017)   Humiliation, Afraid, Rape, and Kick questionnaire    Fear of Current or Ex-Partner: No    Emotionally Abused: No    Physically Abused: No    Sexually Abused: No     Review of Systems: General: negative for chills, fever, night sweats or weight changes.  Cardiovascular: negative for chest pain, dyspnea on exertion, edema, orthopnea, palpitations, paroxysmal nocturnal dyspnea or shortness of breath Dermatological: negative for rash Respiratory: negative for cough or wheezing Urologic: negative for hematuria Abdominal: negative for nausea, vomiting, diarrhea, bright red blood per rectum, melena, or hematemesis Neurologic: negative for visual changes, syncope, or dizziness All other systems reviewed and are otherwise negative except as noted above.    Blood pressure (!) 140/54, pulse 78, height 5' (1.524 m), weight 123 lb (55.8 kg), SpO2 95%.  General appearance: alert and no distress Neck: no adenopathy, no carotid bruit, no JVD, supple, symmetrical, trachea midline, and thyroid not enlarged, symmetric, no tenderness/mass/nodules Lungs: clear to auscultation bilaterally Heart: regular rate and rhythm, S1, S2 normal, no murmur, click, rub or gallop Extremities: extremities normal, atraumatic, no cyanosis or edema Pulses: 2+ and symmetric Skin: Skin color, texture, turgor normal. No rashes or lesions Neurologic: Grossly normal  EKG EKG Interpretation Date/Time:  Wednesday August 02 2023 10:29:52 EDT Ventricular Rate:  78 PR Interval:  148 QRS Duration:  80 QT Interval:  360 QTC Calculation: 410 R Axis:   33  Text Interpretation: Normal sinus rhythm Normal ECG No previous ECGs available Confirmed by Nanetta Batty [phone]) on 08/02/2023 10:34:40 AM    ASSESSMENT AND PLAN:   Hypertension History of essential hypertension with  blood pressure measured today at 140/54.  She is on valsartan/hydrochlorothiazide.  She thinks a lot of her high blood pressure is related to stress and dietary indiscretion.  She is changed her diet.  Hyperlipidemia History of hyperlipidemia on statin therapy with a coronary calcium score of 0.  Her most recent lipid profile performed 06/30/2019 revealed a total cholesterol 147, LDL of 66 and HDL of 58     Runell Gess MD [facility] 08/02/2023 10:41 AM

## 2023-08-02 NOTE — Assessment & Plan Note (Signed)
 History of essential hypertension with blood pressure measured today at 140/54.  She is on valsartan/hydrochlorothiazide.  She thinks a lot of her high blood pressure is related to stress and dietary indiscretion.  She is changed her diet.

## 2024-01-18 ENCOUNTER — Other Ambulatory Visit: Payer: Self-pay | Admitting: Medical Genetics

## 2024-02-14 ENCOUNTER — Other Ambulatory Visit: Payer: Self-pay

## 2024-02-14 DIAGNOSIS — Z006 Encounter for examination for normal comparison and control in clinical research program: Secondary | ICD-10-CM

## 2024-02-24 LAB — GENECONNECT MOLECULAR SCREEN

## 2024-02-27 ENCOUNTER — Ambulatory Visit: Payer: Self-pay

## 2024-02-27 ENCOUNTER — Telehealth: Payer: Self-pay | Admitting: Medical Genetics

## 2024-02-27 DIAGNOSIS — Z006 Encounter for examination for normal comparison and control in clinical research program: Secondary | ICD-10-CM

## 2024-03-05 NOTE — Telephone Encounter (Signed)
 [facility]  03/05/2024 2:54 PM  Confirmed I was speaking with Rhonda Burns [id number] by using name and DOB. Informed participant the reason for this call is to follow-up on a recent sample the participant provided at one of the [facility] lab locations. Informed participant the test was not able to be completed with this sample and apologized for the inconvenience. Participant was requested to provide a new sample at one of our participating labs at no cost so that participant can continue participation and receive test results. Informed participant they do not need to be fasting and if there are other samples that need to be drawn, they can be done at the same visit. Participant has not had a blood transfusion or blood product in the last 30 days. Participant agreed to provide another sample. Participant was provided the [employer] program website to learn why this may have happened. Participant was thanked for their time and continued support of the above study.    Jordyn Pennstrom, BS [place]  [facility] Clinical Research Specialist II Direct Dial: [phone]  Fax: [phone]

## 2024-03-06 ENCOUNTER — Other Ambulatory Visit: Payer: Self-pay

## 2024-03-06 DIAGNOSIS — Z006 Encounter for examination for normal comparison and control in clinical research program: Secondary | ICD-10-CM

## 2024-03-18 LAB — GENECONNECT MOLECULAR SCREEN: Genetic Analysis Overall Interpretation: NEGATIVE
